# Patient Record
Sex: Female | Born: 2000 | Race: Black or African American | Hispanic: No | Marital: Single | Smoking: Never smoker
Health system: Southern US, Community
[De-identification: ages and names within clinical notes are randomized; demographics above are authoritative.]

## PROBLEM LIST (undated history)

## (undated) DIAGNOSIS — F419 Anxiety disorder, unspecified: Secondary | ICD-10-CM

## (undated) DIAGNOSIS — D649 Anemia, unspecified: Secondary | ICD-10-CM

## (undated) HISTORY — DX: Anxiety disorder, unspecified: F41.9

## (undated) HISTORY — PX: NO PAST SURGERIES: SHX2092

## (undated) HISTORY — DX: Anemia, unspecified: D64.9

## (undated) HISTORY — PX: OTHER SURGICAL HISTORY: SHX169

---
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
## 2020-02-01 ENCOUNTER — Telehealth: Payer: Self-pay

## 2020-02-01 NOTE — Telephone Encounter (Signed)
Attempted to call re: PP appt; left voicemail message Sharlette Dense, RN

## 2020-03-29 ENCOUNTER — Encounter: Payer: Self-pay | Admitting: Family Medicine

## 2020-03-29 ENCOUNTER — Other Ambulatory Visit: Payer: Self-pay

## 2020-03-29 ENCOUNTER — Ambulatory Visit: Payer: Medicaid Other | Admitting: Family Medicine

## 2020-03-29 DIAGNOSIS — B9689 Other specified bacterial agents as the cause of diseases classified elsewhere: Secondary | ICD-10-CM | POA: Diagnosis not present

## 2020-03-29 DIAGNOSIS — N760 Acute vaginitis: Secondary | ICD-10-CM | POA: Diagnosis not present

## 2020-03-29 DIAGNOSIS — Z113 Encounter for screening for infections with a predominantly sexual mode of transmission: Secondary | ICD-10-CM

## 2020-03-29 DIAGNOSIS — Z299 Encounter for prophylactic measures, unspecified: Secondary | ICD-10-CM

## 2020-03-29 LAB — WET PREP FOR TRICH, YEAST, CLUE
Trichomonas Exam: NEGATIVE
Yeast Exam: NEGATIVE

## 2020-03-29 MED ORDER — METRONIDAZOLE 0.75 % VA GEL: 1.0000 | Freq: Every day | VAGINAL | 0 refills | Status: AC

## 2020-03-29 MED ORDER — CLOTRIMAZOLE 1 % VA CREA: 1.0000 | TOPICAL_CREAM | Freq: Every day | VAGINAL | 0 refills | Status: AC

## 2020-03-29 NOTE — Progress Notes (Signed)
Patient into clinic with symptoms.  Per wet mount patient with BV and reports that she is not able to tolerate po Metronidazole.  Patient treated for BV with Vandazole 0.75% gel and also given Clotrimazole 1% vaginal cream to use externally for symptoms while using antibiotic get.

## 2020-03-29 NOTE — Progress Notes (Signed)
[facility] Department STI clinic/screening visit  Subjective:  Stefanie Waters is a 19 y.o. female being seen today for an STI screening visit. The patient reports they do have symptoms.  Patient reports that they do not desire a pregnancy in the next year.   They reported they are not interested in discussing contraception today.  Patient's last menstrual period was 03/14/2020.   Patient has the following medical conditions:   Patient Active Problem List   Diagnosis Date Noted  . Normal labor 12/03/2019  . Biological false positive RPR test on 09/29/19 10/07/2019  . Insufficient prenatal care in second trimester 08/27/2019  . Anemia during pregnancy 08/27/2019  . Depressed mood 08/27/2019  . Abdominal pain affecting pregnancy 08/24/2019  . Ovarian cyst affecting pregnancy, antepartum 05/27/2019  . Supervision of normal first pregnancy, antepartum 05/17/2019    No chief complaint on file.   HPI  Patient reports she has an itchy rash in her genital area for several days.  States the last time she had this problem she had BV. States she was treated for BV about 1 month ago- she was given medicine that tasted bad.  States she and partner waited 2 weeks before resuming sex.  Last HIV test per patient/review of record was 2020 Patient reports last pap was never.   See flowsheet for further details and programmatic requirements.    The following portions of the patient's history were reviewed and updated as appropriate: allergies, current medications, past medical history, past social history, past surgical history and problem list.  Objective:  There were no vitals filed for this visit.  Physical Exam Vitals and nursing note reviewed.  Constitutional:      Appearance: Normal appearance.  HENT:     Head: Normocephalic and atraumatic.     Mouth/Throat:     Mouth: Mucous membranes are moist.     Pharynx: Oropharynx is clear. No oropharyngeal exudate or posterior  oropharyngeal erythema.  Pulmonary:     Effort: Pulmonary effort is normal.  Abdominal:     General: Abdomen is flat.     Palpations: There is no mass.     Tenderness: There is no abdominal tenderness. There is no rebound.  Genitourinary:    General: Normal vulva.     Exam position: Lithotomy position.     Pubic Area: No rash or pubic lice.      Labia:        Right: No rash or lesion.        Left: No rash or lesion.      Vagina: Vaginal discharge present. No erythema, bleeding or lesions.     Cervix: No cervical motion tenderness, discharge, friability, lesion or erythema.     Uterus: Normal.      Adnexa: Right adnexa normal and left adnexa normal.     Rectum: Normal.     Comments: Thin white disch with fishy odor. PH > 4.5 Lymphadenopathy:     Head:     Right side of head: No preauricular or posterior auricular adenopathy.     Left side of head: No preauricular or posterior auricular adenopathy.     Cervical: No cervical adenopathy.     Upper Body:     Right upper body: No supraclavicular or axillary adenopathy.     Left upper body: No supraclavicular or axillary adenopathy.     Lower Body: No right inguinal adenopathy. No left inguinal adenopathy.  Skin:    General: Skin is warm and  dry.     Findings: No rash.  Neurological:     Mental Status: She is alert and oriented to person, place, and time.    Assessment and Plan:  Stefanie Waters is a 19 y.o. female presenting to the [facility] Department for STI screening  1. Screening examination for venereal disease  - WET PREP FOR TRICH, YEAST, CLUE - Chlamydia/Gonorrhea [place] [facility] - HIV [place] [facility] - Syphilis Serology, [place]  1. Screening examination for venereal disease  - WET PREP FOR TRICH, YEAST, CLUE - Chlamydia/Gonorrhea [place] [facility] - HIV [place] [facility] - Syphilis Serology, [facility]  2. BV (bacterial vaginosis)  - metroNIDAZOLE (VANDAZOLE) 0.75 % vaginal gel; Place 1 Applicatorful  vaginally at bedtime for 5 days.  Dispense: 70 g; Refill: 0  3. Prophylactic measure  - clotrimazole (CLOTRIMAZOLE-7) 1 % vaginal cream; Place 1 Applicatorful vaginally at bedtime for 7 days.  Dispense: 45 g; Refill: 0    No follow-ups on file.  No future appointments.  Larene Pickett, FNP

## 2020-04-02 NOTE — Progress Notes (Signed)
Chart reviewed by Pharmacist  Suzanne Walker PharmD, Contract Pharmacist at [facility]  

## 2020-05-11 ENCOUNTER — Encounter: Payer: Self-pay | Admitting: Physician Assistant

## 2020-05-11 ENCOUNTER — Other Ambulatory Visit: Payer: Self-pay

## 2020-05-11 ENCOUNTER — Ambulatory Visit: Payer: Medicaid Other | Admitting: Physician Assistant

## 2020-05-11 DIAGNOSIS — Z113 Encounter for screening for infections with a predominantly sexual mode of transmission: Secondary | ICD-10-CM | POA: Diagnosis not present

## 2020-05-11 DIAGNOSIS — N760 Acute vaginitis: Secondary | ICD-10-CM | POA: Diagnosis not present

## 2020-05-11 DIAGNOSIS — B9689 Other specified bacterial agents as the cause of diseases classified elsewhere: Secondary | ICD-10-CM

## 2020-05-11 LAB — WET PREP FOR TRICH, YEAST, CLUE
Trichomonas Exam: NEGATIVE
Yeast Exam: NEGATIVE

## 2020-05-11 MED ORDER — METRONIDAZOLE 500 MG PO TABS: 500.0000 mg | ORAL_TABLET | Freq: Two times a day (BID) | ORAL | 0 refills | Status: AC

## 2020-05-11 NOTE — Progress Notes (Signed)
Post:  RN reviewed wetmount with patient. Metro #14 dispensed per C. Hampton orders. Provider orders complete.   Harvie Heck, RN

## 2020-05-11 NOTE — Progress Notes (Signed)
[facility] Department STI clinic/screening visit  Subjective:  Stefanie Waters is a 19 y.o. female being seen today for an STI screening visit. The patient reports they do have symptoms.  Patient reports that they do not desire a pregnancy in the next year.   They reported they are not interested in discussing contraception today.  Patient's last menstrual period was 04/16/2020.   Patient has the following medical conditions:   Patient Active Problem List   Diagnosis Date Noted  . Normal labor 12/03/2019  . Biological false positive RPR test on 09/29/19 10/07/2019  . Insufficient prenatal care in second trimester 08/27/2019  . Anemia during pregnancy 08/27/2019  . Depressed mood 08/27/2019  . Abdominal pain affecting pregnancy 08/24/2019  . Ovarian cyst affecting pregnancy, antepartum 05/27/2019  . Supervision of normal first pregnancy, antepartum 05/17/2019    Chief Complaint  Patient presents with  . SEXUALLY TRANSMITTED DISEASE    screening    HPI  Patient reports that she has had a discharge with a "sour" smell for 1 week.  Denies other symptoms.  States that she has a 11 month old daughter and that she is not breastfeeding.  Reports her last HIV test was 1 month ago and has not had a pap since she is not yet 19 yo.  States that she is using condoms as her BCM.    See flowsheet for further details and programmatic requirements.    The following portions of the patient's history were reviewed and updated as appropriate: allergies, current medications, past medical history, past social history, past surgical history and problem list.  Objective:  There were no vitals filed for this visit.  Physical Exam Constitutional:      General: She is not in acute distress.    Appearance: Normal appearance.  HENT:     Head: Normocephalic and atraumatic.     Comments: No nits,lice, or hair loss. No cervical, supraclavicular or axillary adenopathy.    Mouth/Throat:      Mouth: Mucous membranes are moist.     Pharynx: Oropharynx is clear. No oropharyngeal exudate or posterior oropharyngeal erythema.  Eyes:     Conjunctiva/sclera: Conjunctivae normal.  Pulmonary:     Effort: Pulmonary effort is normal.  Abdominal:     Palpations: Abdomen is soft. There is no mass.     Tenderness: There is no abdominal tenderness. There is no guarding or rebound.  Genitourinary:    General: Normal vulva.     Rectum: Normal.     Comments: External genitalia/pubic area without nits, lice, edema, erythema, lesions and inguinal adenopathy. Vagina with normal mucosa and small amount of yellowish/creamy discharge discharge, pH=>4.5. Cervix without visible lesions. Uterus firm, mobile, nt, no masses, no CMT, no adnexal tenderness or fullness. Musculoskeletal:     Cervical back: Neck supple. No tenderness.  Skin:    General: Skin is warm and dry.     Findings: No bruising, erythema, lesion or rash.  Neurological:     Mental Status: She is alert and oriented to person, place, and time.  Psychiatric:        Mood and Affect: Mood normal.        Thought Content: Thought content normal.        Judgment: Judgment normal.      Assessment and Plan:  Stefanie Waters is a 19 y.o. female presenting to the [facility] Department for STI screening  1. Screening for STD (sexually transmitted disease) Patient into clinic  with symptoms. Rec condoms with all sex. Await test results.  Counseled that RN will call if needs to RTC for treatment once results are back. - WET PREP FOR TRICH, YEAST, CLUE - Chlamydia/Gonorrhea [place] [facility] - HIV [place] [facility] - Syphilis Serology,  Lab  2. BV (bacterial vaginosis) Reviewed Steps to prevent BV and yeast: Wear all-cotton underwear Sleep without underwear Take showers instead of baths Wear loose fitting clothing, especially during warm/hot weather Use a hair dryer on low after bathing to dry the area Avoid scented  soaps and body washes Do not douche May try over the counter probiotics or boric acid gel or suppositories Stop smoking Will treat for BV with Metronidazole 500 mg #14 1 po BID for 7 days with food, no EtOH for 24 hr before and until 72 hr after completing medicine. No sex for 7 days. Enc to use OTC antifungal cream if has increased itching during or just after treatment with antibiotics. - metroNIDAZOLE (FLAGYL) 500 MG tablet; Take 1 tablet (500 mg total) by mouth 2 (two) times daily for 7 days.  Dispense: 14 tablet; Refill: 0     No follow-ups on file.  No future appointments.  Matt Holmes, PA

## 2020-05-23 ENCOUNTER — Telehealth: Payer: Self-pay | Admitting: Family Medicine

## 2020-05-23 NOTE — Telephone Encounter (Signed)
Call to patient to discuss positive TR.  Patient verified by password.  Patient informed of + CT result.  Patient schedueld for treatment appointment.  Patient verbalizes understanding at this time and has no further questions. Bethenny Losee, RN  

## 2020-05-24 ENCOUNTER — Other Ambulatory Visit: Payer: Self-pay

## 2020-05-24 ENCOUNTER — Ambulatory Visit: Payer: Medicaid Other

## 2020-05-24 DIAGNOSIS — A749 Chlamydial infection, unspecified: Secondary | ICD-10-CM

## 2020-05-24 MED ORDER — AZITHROMYCIN 500 MG PO TABS
1000.0000 mg | ORAL_TABLET | Freq: Once | ORAL | Status: AC
  Administered 2020-05-24: 1000 mg via ORAL

## 2020-05-30 ENCOUNTER — Telehealth: Payer: Self-pay | Admitting: Family Medicine

## 2020-05-30 NOTE — Telephone Encounter (Signed)
Call to patient. Patient reports her vagina felt swollen and having bloody discharge after taking the azithromycin on 10/13 for chlamydia. Patient denies having any medication allergies, denies vomiting medication after DOT, and reports that after taking a bath and shaving the swelling went away. Patient also denied being on her  period. RN counseled patient that bloody discharge can come from having an infection and that if the symptoms are worsening that she should schedule an appt for follow up. Patient reports her symptoms are getting better and will call if the symptoms come back.   Harvie Heck, RN

## 2020-05-30 NOTE — Telephone Encounter (Signed)
Patient is calling regarding question about treatment for STI. Patient is not sure if she is having a reaction to the medicine she took a week ago. Stating she is having bloody discharge.

## 2020-10-31 IMAGING — US US OB COMP LESS 14 WK
1 series · 14 of 28 positions shown · non-contrast
Comparison: None.

CLINICAL DATA: Pelvic pain and vaginal bleeding. Fourteen weeks and
5 days pregnant by last menstrual period.

EXAM:
OBSTETRIC <14 WK ULTRASOUND
TECHNIQUE: Transabdominal ultrasound was performed for evaluation of the
gestation as well as the maternal uterus and adnexal regions.

[Series 1: us ob comp less 14 wk · 14 of 48 slices shown]
[im 2/48]
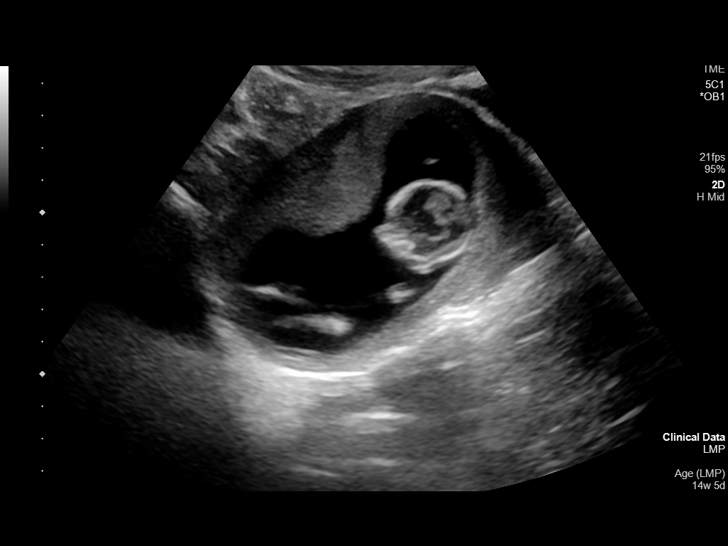
[im 6/48]
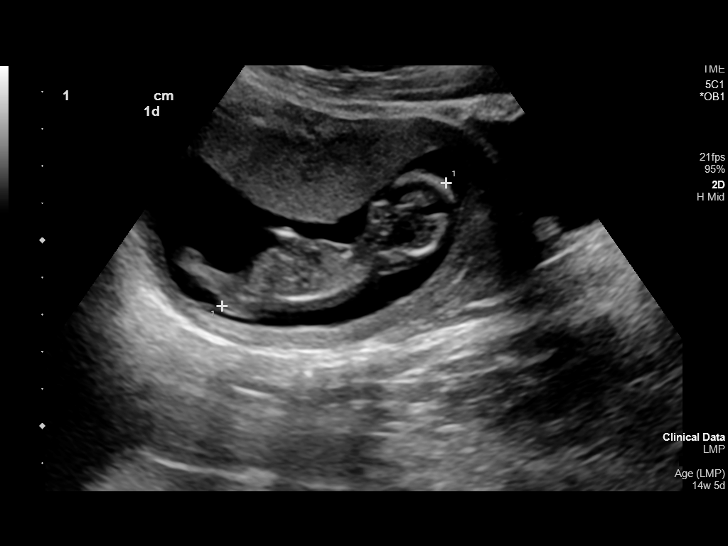
[im 9/48]
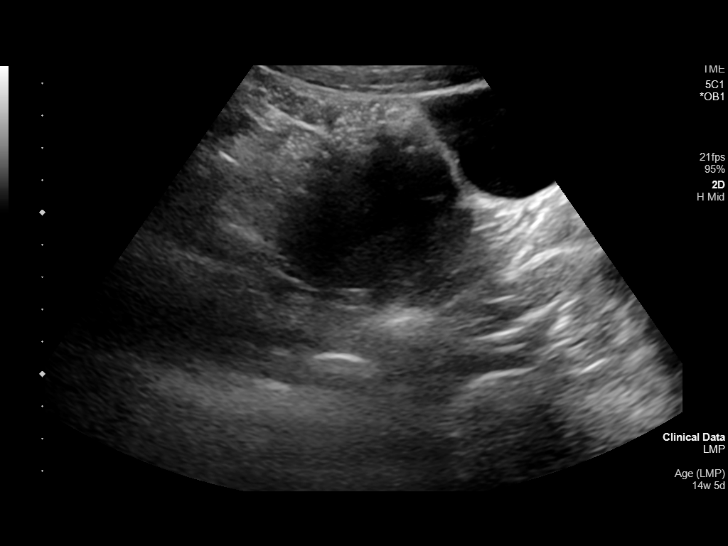
[im 13/48]
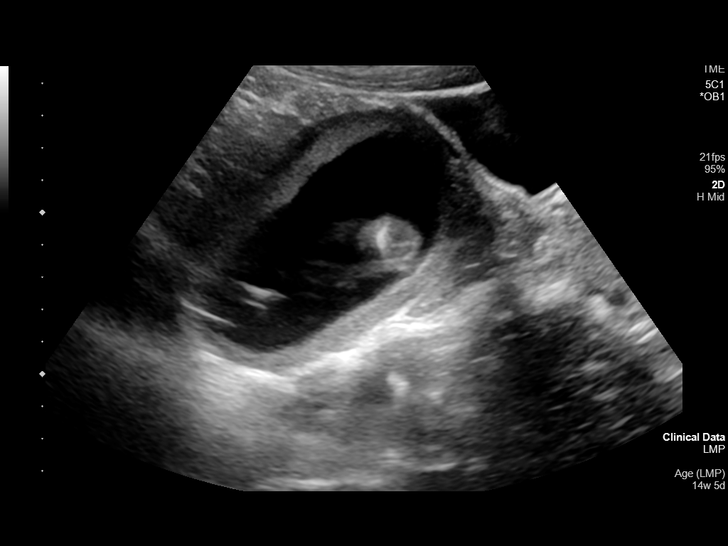
[im 16/48]
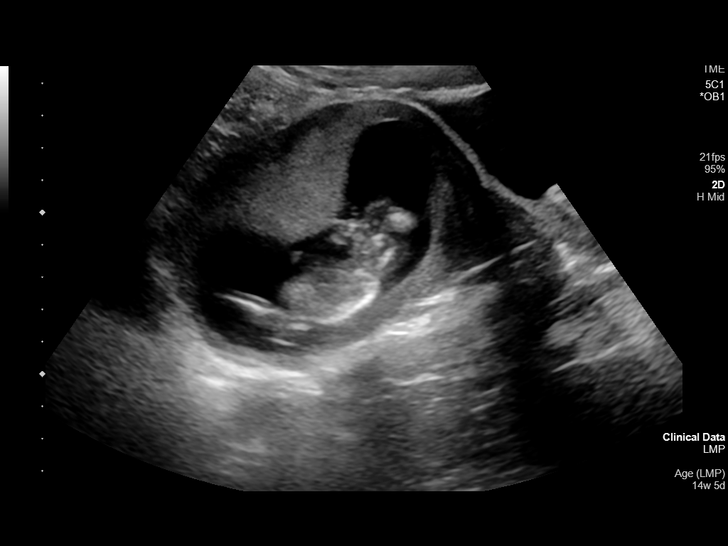
[im 20/48]
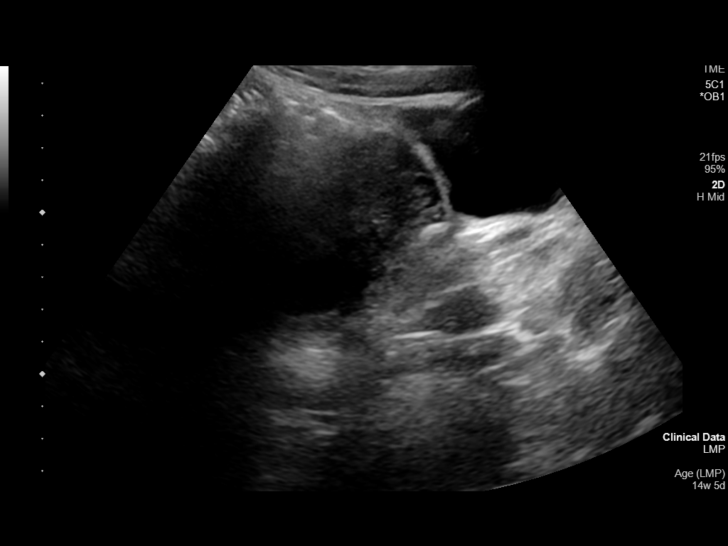
[im 23/48]
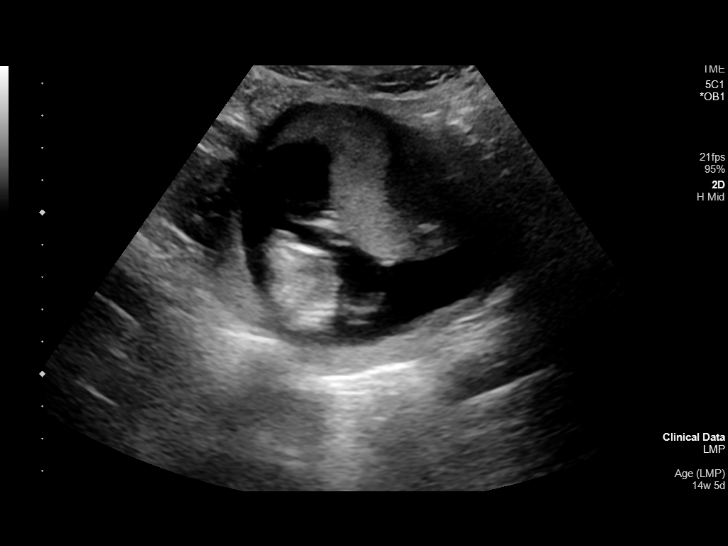
[im 27/48]
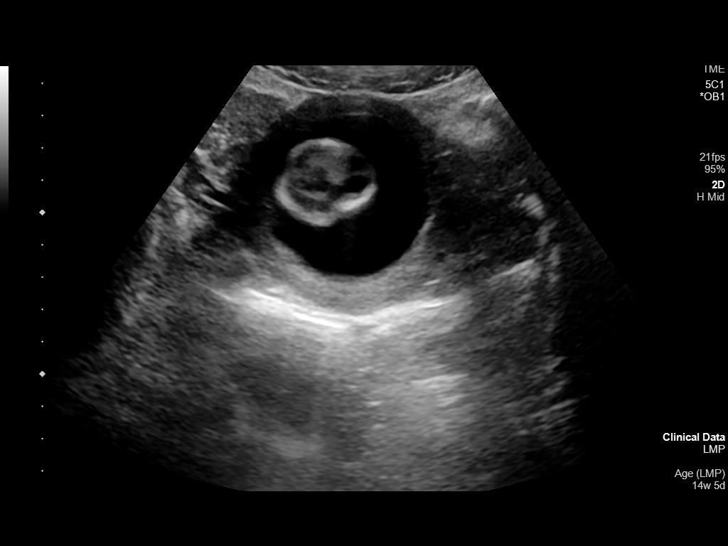
[im 30/48]
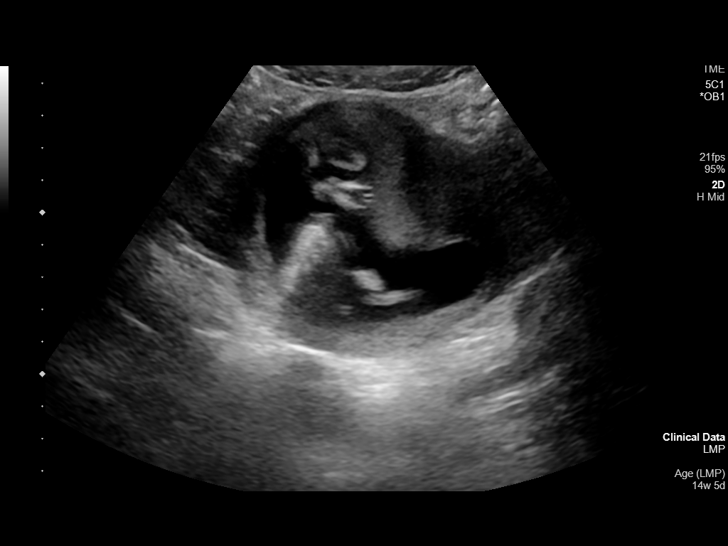
[im 34/48]
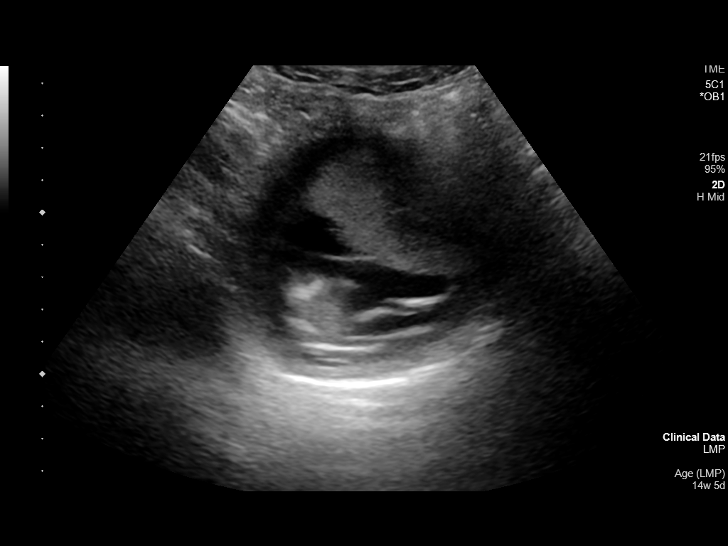
[im 37/48]
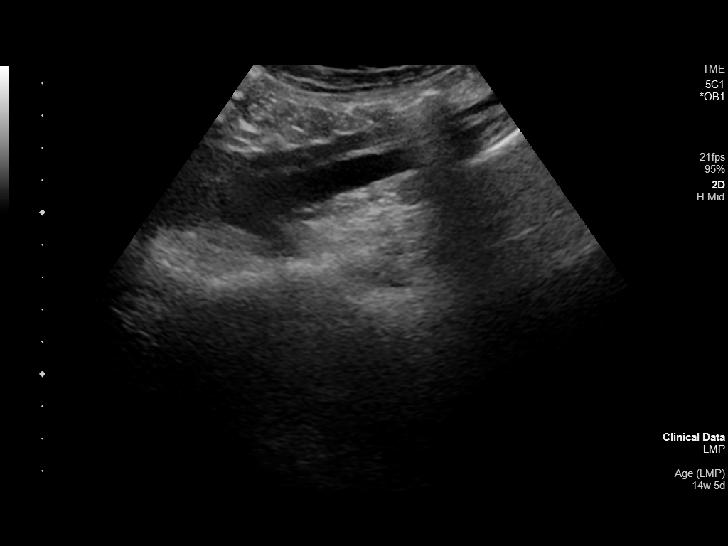
[im 41/48]
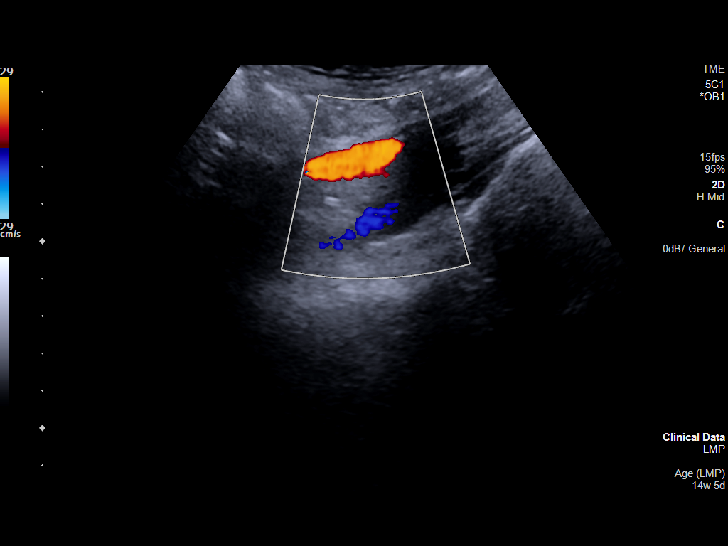
[im 44/48]
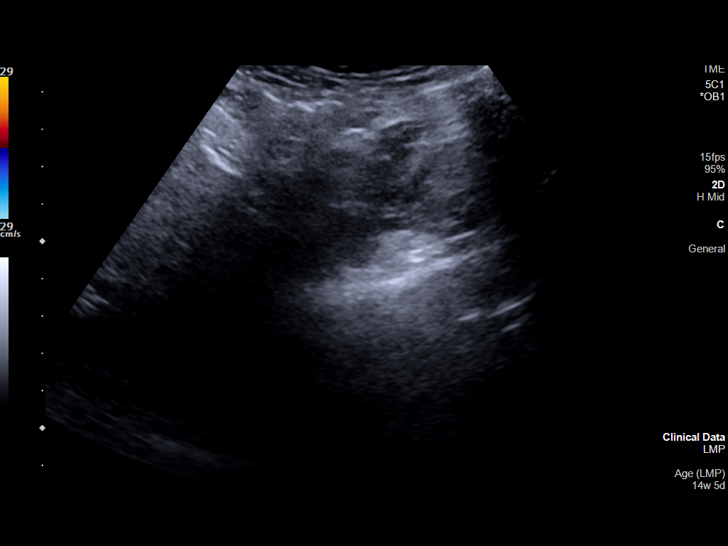
[im 48/48]
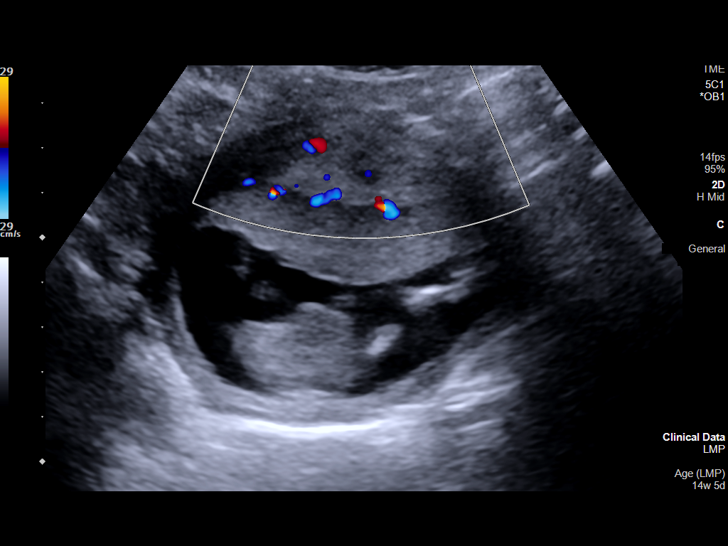

[14 of 28 positions shown; findings below may reference images not displayed]

FINDINGS: Intrauterine gestational sac: Visualized

Yolk sac:  Not visualized

Embryo:  Visualized

Cardiac Activity: Visualized

Heart Rate: 171 bpm

CRL:   68.7 mm   13 w 1 d                  US EDC: 12/17/2019

Subchorionic hemorrhage:  None visualized.

Maternal uterus/adnexae: The ovaries were not visualized. No free
peritoneal fluid.
IMPRESSION: 1. Single live intrauterine gestation with an estimated gestational
age of 13 weeks and 1 day. No complicating features.
2. Nonvisualized maternal ovaries.

## 2021-01-16 ENCOUNTER — Other Ambulatory Visit: Payer: Self-pay

## 2021-01-16 ENCOUNTER — Ambulatory Visit: Payer: Medicaid Other | Admitting: Physician Assistant

## 2021-01-16 DIAGNOSIS — Z202 Contact with and (suspected) exposure to infections with a predominantly sexual mode of transmission: Secondary | ICD-10-CM

## 2021-01-16 DIAGNOSIS — Z113 Encounter for screening for infections with a predominantly sexual mode of transmission: Secondary | ICD-10-CM

## 2021-01-16 LAB — WET PREP FOR TRICH, YEAST, CLUE
Trichomonas Exam: NEGATIVE
Yeast Exam: NEGATIVE

## 2021-01-16 MED ORDER — DOXYCYCLINE HYCLATE 100 MG PO TABS: 100.0000 mg | ORAL_TABLET | Freq: Two times a day (BID) | ORAL | 0 refills | Status: AC

## 2021-01-16 NOTE — Progress Notes (Signed)
[facility] Department STI clinic/screening visit  Subjective:  Stefanie Waters is a 20 y.o. female being seen today for an STI screening visit. The patient reports they do have symptoms.  Patient reports that they do not desire a pregnancy in the next year.   They reported they are not interested in discussing contraception today.  No LMP recorded.   Patient has the following medical conditions:   Patient Active Problem List   Diagnosis Date Noted  . Normal labor 12/03/2019  . Biological false positive RPR test on 09/29/19 10/07/2019  . Insufficient prenatal care in second trimester 08/27/2019  . Anemia during pregnancy 08/27/2019  . Depressed mood 08/27/2019  . Abdominal pain affecting pregnancy 08/24/2019  . Ovarian cyst affecting pregnancy, antepartum 05/27/2019  . Supervision of normal first pregnancy, antepartum 05/17/2019    Chief Complaint  Patient presents with  . SEXUALLY TRANSMITTED DISEASE    screening    HPI  Patient reports that she is a contact to Chlamydia.  Reports that she has also had spotting and cramping for 1-2 weeks.  Denies chronic conditions, surgeries and regular medicines.  LMP 01/03/2021 and using condoms with all sex.  States last HIV test was in 2021 and has not had a pap since she is not yet 20 yo.   See flowsheet for further details and programmatic requirements.    The following portions of the patient's history were reviewed and updated as appropriate: allergies, current medications, past medical history, past social history, past surgical history and problem list.  Objective:  There were no vitals filed for this visit.  Physical Exam Constitutional:      General: She is not in acute distress.    Appearance: Normal appearance.  HENT:     Head: Normocephalic and atraumatic.  Eyes:     Conjunctiva/sclera: Conjunctivae normal.  Pulmonary:     Effort: Pulmonary effort is normal.  Genitourinary:    Rectum: Normal.  Skin:     General: Skin is warm and dry.  Neurological:     Mental Status: She is alert and oriented to person, place, and time.  Psychiatric:        Mood and Affect: Mood normal.        Behavior: Behavior normal.        Thought Content: Thought content normal.        Judgment: Judgment normal.      Assessment and Plan:  Stefanie Waters is a 20 y.o. female presenting to the [facility] Department for STI screening  1. Screening for STD (sexually transmitted disease) Patient into clinic with symptoms. Patient declines pelvic by provider and opts to self collect vaginal samples.  Counseled how to collect for accurate results.  Rec condoms with all sex. Await test results.  Counseled that RN will call if needs to RTC for treatment once results are back. - WET PREP FOR TRICH, YEAST, CLUE - Chlamydia/Gonorrhea [place] [facility] - [facility] [place] [facility] - Syphilis Serology, [place] [facility]  2. Chlamydia contact Will treat as a contact to Chlamydia with Doxycycline 100 mg #14 1 po BID for 7 days. No sex for 14 days and until after partner completes treatment. Enc to use OTC antifungal cream if has itching during or just after treatment with antibiotic. - doxycycline (VIBRA-TABS) 100 MG tablet; Take 1 tablet (100 mg total) by mouth 2 (two) times daily for 7 days.  Dispense: 14 tablet; Refill: 0     No  follow-ups on file.  No future appointments.  Matt Holmes, PA

## 2021-01-17 ENCOUNTER — Encounter: Payer: Self-pay | Admitting: Physician Assistant

## 2021-01-18 LAB — HM HIV SCREENING LAB: HM HIV Screening: NEGATIVE

## 2021-01-20 NOTE — Progress Notes (Signed)
Chart reviewed by Pharmacist  Suzanne Walker PharmD, Contract Pharmacist at [facility]  

## 2021-07-23 ENCOUNTER — Ambulatory Visit: Payer: Medicaid Other

## 2021-10-31 ENCOUNTER — Ambulatory Visit: Payer: Medicaid Other | Admitting: Licensed Clinical Social Worker

## 2021-10-31 DIAGNOSIS — F411 Generalized anxiety disorder: Secondary | ICD-10-CM
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
## 2021-11-14 ENCOUNTER — Ambulatory Visit: Payer: Medicaid Other | Admitting: Licensed Clinical Social Worker

## 2021-11-14 NOTE — Progress Notes (Unsigned)
Counselor/Therapist Progress Note ? ?Patient ID: Stefanie Waters, MRN: [id number],   ? ?Date: 11/14/2021 ? ?Time Spent: ***  ? ?Treatment Type: Psychotherapy ? ?Reported Symptoms: {CHL AMB Reported [phone]} ? ?Mental Status Exam: ? ?Appearance:   {PSY:22683}     ?Behavior:  {PSY:[id number]}  ?Motor:  {PSY:22302}  ?Speech/Language:   {PSY:22685}  ?Affect:  {PSY:22687}  ?Mood:  {PSY:31886}  ?Thought process:  {PSY:31888}  ?Thought content:    {PSY:661 087 [id number]}  ?Sensory/Perceptual disturbances:    {PSY:[id number]}  ?Orientation:  {PSY:30297}  ?Attention:  {PSY:22877}  ?Concentration:  {PSY:415 378 4794}  ?Memory:  {PSY:[id number]}  ?Fund of knowledge:   {PSY:415 378 4794}  ?Insight:    {PSY:415 378 4794}  ?Judgment:   {PSY:415 378 4794}  ?Impulse Control:  {PSY:415 378 4794}  ? ?Risk Assessment: ?Danger to Self:  No ?Self-injurious Behavior: No ?Danger to Others: No ?Duty to Warn:no ?Physical Aggression / Violence:No  ?Access to Firearms a concern: No  ?Gang Involvement:No  ? ?Subjective: Patient was engaged and cooperative throughout the session using time effectively to discuss  ? ?Patient voices continued motivation for treatment and understanding of  . Patient is likely to benefit from future treatment because  remains motivated to decrease  And   and reports benefit of regular sessions in addressing these symptoms.  ?  ? ?Interventions: Cognitive Behavioral Therapy ? Checked in with patient and reviewed previous session, including assessment and goal of treatment. Reviewed CBTs. Explored patient's goal of treatment and worked collaboratively to develop CBT treatment plan. Provided support through active listening, validation of feelings, and highlighted patient's strengths. ? ? ?Diagnosis:No diagnosis found. ? ?Plan: Patient's goal of treatment is to be a better person, doesn't want her hurt to limit her life and what she wants out of life  ? ?Future Appointments  ?Date Time Provider [place]  ?11/14/2021   1:50 PM Milton Ferguson, LCSW [facility] None  ? ? ? ? ?Milton Ferguson, LCSW ? ?

## 2021-11-15 ENCOUNTER — Telehealth: Payer: Self-pay | Admitting: Licensed Clinical Social Worker

## 2021-11-15 NOTE — Telephone Encounter (Signed)
LCSW returned patient's voice message to reschedule her missed appointment- LCSW lvm.  ?

## 2021-11-29 ENCOUNTER — Ambulatory Visit: Payer: Medicaid Other | Admitting: Licensed Clinical Social Worker

## 2021-11-29 DIAGNOSIS — F411 Generalized anxiety disorder: Secondary | ICD-10-CM

## 2021-11-29 NOTE — Progress Notes (Signed)
Counselor/Therapist Progress Note ? ?Patient ID: Stefanie Waters, MRN: [id number],   ? ?Date: 11/29/2021 ? ?Time Spent: 46 minutes  ? ?Treatment Type: Psychotherapy ? ?Reported Symptoms:  Anxiety, anxious thought, panic attack; sadness and low mood  ? ?Mental Status Exam: ? ?Appearance:   Casual, Neat, and Well Groomed     ?Behavior:  Appropriate  ?Motor:  Normal  ?Speech/Language:   Clear and Coherent  ?Affect:  Appropriate and Congruent  ?Mood:  normal  ?Thought process:  normal  ?Thought content:    WNL  ?Sensory/Perceptual disturbances:    WNL  ?Orientation:  oriented to person, place, time/date, and situation  ?Attention:  Good  ?Concentration:  Good  ?Memory:  WNL  ?Fund of knowledge:   Good  ?Insight:    Good  ?Judgment:   Good  ?Impulse Control:  Good  ? ?Risk Assessment: ?Danger to Self:  No ?Self-injurious Behavior: No ?Danger to Others: No ?Duty to Warn:no ?Physical Aggression / Violence:No  ?Access to Firearms a concern: No  ?Gang Involvement:No  ? ?Subjective: Patient was engaged and cooperative throughout the session using time effectively to discuss thoughts, feelings, treatment plan, and Core beliefs  ?Patient is likely to benefit from future treatment because she is motivated to decrease anxiety and improve functioning  and reports benefit of sessions in addressing these symptoms.  ? ?Interventions: Cognitive Behavioral Therapy ? Checked in with patient and reviewed previous session, including assessment and goal of treatment. Reviewed CBTs. Explored patient's goal of treatment and worked collaboratively to develop CBTs treatment plan. Provided psychoeducation on Core Beliefs and engaged patient in working through worksheet- identifying and disputing the evidence. Provided support through active listening, validation of feelings, and highlighted patient's strengths.  ? ?Diagnosis: ?  ICD-10-CM   ?1. GAD (generalized anxiety disorder)  F41.1   ?  ? ?Plan: Patient's goal of treatment is to be a better  person, doesn't want her hurt to limit her life and what she wants out of life  ?Treatment Target: Understand the relationship between thoughts, emotions, and behaviors  ?Psychoeducation on CBT model   ?Teach the connection between thoughts, emotions, and behaviors  ?Treatment Target: Increase realistic balanced thinking -to learn how to replace thinking with thoughts that are more accurate or helpful ?Explore patient's thoughts, beliefs, automatic thoughts, assumptions  ?Identify and replace unhelpful thinking patterns (upsetting ideas, self-talk and mental images) ?Process distress and allow for emotional release  ?Questioning and challenging thoughts ?Cognitive reappraisal  ?Restructuring, Socratic questioning  ?Provided psychoeducation on core beliefs, explore, and assist patient in identifying core beliefs  ?Treatment Target: Cognitive Defusion  ?Psychoeducation about cognitive fusion  ?Assess areas of life in which the client exhibits psychological flexibility  ?Examine what sort of unhelpful cognitive content the client is fusing with  ?Teach defusion skills  ?Bring awareness to thoughts ?Treatment Target: Increase Contact With the Present Moment  ?Explore client's ability to be ?in-touch? with the present moment ?Teach mindfulness skills  ?Noting or describing  ? ?Future Appointments  ?Date Time Provider Department Center  ?12/20/2021  3:00 PM Kathreen Cosier, LCSW [facility] None  ? ? ?Kathreen Cosier, LCSW ? ?

## 2021-12-20 ENCOUNTER — Ambulatory Visit: Payer: Medicaid Other | Admitting: Licensed Clinical Social Worker

## 2021-12-20 DIAGNOSIS — F411 Generalized anxiety disorder: Secondary | ICD-10-CM

## 2021-12-20 NOTE — Progress Notes (Signed)
Counselor/Therapist Progress Note ? ?Patient ID: Stefanie Waters, MRN: [id number],   ? ?Date: 12/20/2021 ? ?Time Spent: 50 minutes  ? ?Treatment Type: Psychotherapy ? ?Reported Symptoms:  Depressed mood, anxiety, ruminating thoughts, worries, irritability  ? ?Mental Status Exam: ? ?Appearance:   Casual, Neat, and Well Groomed     ?Behavior:  Appropriate and Sharing  ?Motor:  Normal  ?Speech/Language:   Clear and Coherent and Normal Rate  ?Affect:  Appropriate and Congruent  ?Mood:  normal  ?Thought process:  normal  ?Thought content:    WNL  ?Sensory/Perceptual disturbances:    WNL  ?Orientation:  oriented to person, place, time/date, and situation  ?Attention:  Good  ?Concentration:  Good  ?Memory:  WNL  ?Fund of knowledge:   Good  ?Insight:    Good  ?Judgment:   Good  ?Impulse Control:  Good  ? ?Risk Assessment: ?Danger to Self:  No ?Self-injurious Behavior: No ?Danger to Others: No ?Duty to Warn:no ?Physical Aggression / Violence:No  ?Access to Firearms a concern: No  ?Gang Involvement:No  ? ?Subjective: Patient was receptive to feedback and intervention from LCSW and actively and effectively participated throughout the session. Patient is likely to benefit from future treatment because she remains motivated to decrease symptoms and reports benefit of regular sessions in addressing unresolved childhood trauma.     ? ?Interventions: Cognitive Behavioral Therapy ?Checked in with patient regarding her week. Engaged patient in processing unresolved issues related to her childhood, validating her feelings of anger and sadness and reframing and challenging unhelpful thoughts leading to distress. Taught patient ABCD method and provided worksheet for home work task. Provided support through active listening, validation of feelings, and highlighted patient's strengths.   ? ?Diagnosis: ?  ICD-10-CM   ?1. GAD (generalized anxiety disorder)  F41.1   ?  ? ?Plan: Check in on homework task ABCD Method  ? ?Patient's goal of  treatment is to be a better person, doesn't want her hurt to limit her life and what she wants out of life  ?Treatment Target: Understand the relationship between thoughts, emotions, and behaviors  ?Psychoeducation on CBT model   ?Teach the connection between thoughts, emotions, and behaviors  ?Treatment Target: Increase realistic balanced thinking -to learn how to replace thinking with thoughts that are more accurate or helpful ?Explore patient's thoughts, beliefs, automatic thoughts, assumptions  ?Identify and replace unhelpful thinking patterns (upsetting ideas, self-talk and mental images) ?Process distress and allow for emotional release  ?Questioning and challenging thoughts ?Cognitive reappraisal  ?Restructuring, Socratic questioning  ?Provided psychoeducation on core beliefs, explore, and assist patient in identifying core beliefs  ?Treatment Target: Cognitive Defusion  ?Psychoeducation about cognitive fusion  ?Assess areas of life in which the client exhibits psychological flexibility  ?Examine what sort of unhelpful cognitive content the client is fusing with  ?Teach defusion skills  ?Bring awareness to thoughts ?Treatment Target: Increase Contact With the Present Moment  ?Explore client's ability to be ?in-touch? with the present moment ?Teach mindfulness skills  ?Noting or describing  ? ?Future Appointments  ?Date Time Provider Department Center  ?01/02/2022  4:00 PM Kathreen Cosier, LCSW [facility] None  ? ?Kathreen Cosier, LCSW ? ?

## 2022-01-02 ENCOUNTER — Ambulatory Visit: Payer: Medicaid Other | Admitting: Licensed Clinical Social Worker

## 2022-01-02 DIAGNOSIS — F411 Generalized anxiety disorder: Secondary | ICD-10-CM

## 2022-01-02 NOTE — Progress Notes (Signed)
Counselor/Therapist Progress Note  Patient ID: Stefanie Waters, MRN: [id number],    Date: 01/02/2022  Time Spent: 45 minutes   Treatment Type: Psychotherapy  Reported Symptoms:  reduction in overall anxiety and mood symptoms  Mental Status Exam:  Appearance:   Casual and Neat     Behavior:  Appropriate, Sharing, and Motivated  Motor:  Normal  Speech/Language:   Clear and Coherent and Normal Rate  Affect:  Appropriate and Congruent  Mood:  normal  Thought process:  normal  Thought content:    WNL  Sensory/Perceptual disturbances:    WNL  Orientation:  oriented to person, place, time/date, and situation  Attention:  Good  Concentration:  Good  Memory:  WNL  Fund of knowledge:   Good  Insight:    Good  Judgment:   Good  Impulse Control:  Good   Risk Assessment: Danger to Self:  No Self-injurious Behavior: No Danger to Others: No Duty to Warn:no Physical Aggression / Violence:No  Access to Firearms a concern: No  Gang Involvement:No   Subjective: Patient was receptive to feedback and intervention from LCSW and actively and effectively participated throughout the session. Patient completed homework task- ABCD worksheet and remains motivated to decrease  anxiety and depressive symptoms and reports benefit of regular sessions in addressing these symptoms.    Interventions: Cognitive Behavioral Therapy Checked in with patient regarding her week. Reviewed previous session homework - ABCD Method. LCSW and patient also reviewed use of reframing thoughts around unresolved issues with her biological mother, and discussed patient's experience of improvement in overall mood and anxiety. LCSW and patient discussed comfort found in sadness, LCSW validated patient's experience of this loss of a part of her identify and also taught patient about Polyvagal Theory as a way to discuss possible comfort found in sadness.Provided support through active listening, validation of feelings, and  highlighted patient's strengths.    Diagnosis:   ICD-10-CM   1. GAD (generalized anxiety disorder)  F41.1      Plan: Patient's goal of treatment is to be a better person, doesn't want her hurt to limit her life and what she wants out of life   Treatment Target: Understand the relationship between thoughts, emotions, and behaviors  Psychoeducation on CBT model   Teach the connection between thoughts, emotions, and behaviors  Treatment Target: Increase realistic balanced thinking -to learn how to replace thinking with thoughts that are more accurate or helpful Explore patient's thoughts, beliefs, automatic thoughts, assumptions  Identify and replace unhelpful thinking patterns (upsetting ideas, self-talk and mental images) Process distress and allow for emotional release  Questioning and challenging thoughts Cognitive reappraisal  Restructuring, Socratic questioning  Provided psychoeducation on core beliefs, explore, and assist patient in identifying core beliefs  Treatment Target: Cognitive Defusion  Psychoeducation about cognitive fusion  Assess areas of life in which the client exhibits psychological flexibility  Examine what sort of unhelpful cognitive content the client is fusing with  Teach defusion skills  Bring awareness to thoughts Treatment Target: Increase Contact With the Present Moment  Explore client's ability to be "in-touch" with the present moment Teach mindfulness skills  Noting or describing   Future Appointments  Date Time Provider Department Center  01/16/2022  4:00 PM Kathreen Cosier, LCSW [facility] None    Kathreen Cosier, LCSW

## 2022-01-16 ENCOUNTER — Ambulatory Visit: Payer: Medicaid Other | Admitting: Licensed Clinical Social Worker

## 2022-02-13 ENCOUNTER — Telehealth: Payer: Self-pay | Admitting: Licensed Clinical Social Worker

## 2022-02-13 NOTE — Telephone Encounter (Signed)
Patient lvm requesting an appointment.  

## 2022-02-28 ENCOUNTER — Telehealth: Payer: Self-pay | Admitting: Licensed Clinical Social Worker

## 2022-02-28 NOTE — Telephone Encounter (Signed)
Patient left vm for LCSW requesting to schedule an appt. LCSW attempted to call patient back and left half a vm before phone disconnected.

## 2022-03-13 ENCOUNTER — Ambulatory Visit: Payer: Medicaid Other | Admitting: Licensed Clinical Social Worker

## 2022-03-13 DIAGNOSIS — F411 Generalized anxiety disorder: Secondary | ICD-10-CM

## 2022-03-13 NOTE — Progress Notes (Signed)
Counselor/Therapist Progress Note  Patient ID: Stefanie Waters, MRN: [id number],    Date: 03/13/2022  Time Spent: 45 minutes    Treatment Type: Psychotherapy  Reported Symptoms:  Depressed mood, anhedonia, increased need for sleep, isolating   Mental Status Exam:  Appearance:   Casual and Neat     Behavior:  Appropriate and Sharing  Motor:  Normal  Speech/Language:   Clear and Coherent and Normal Rate  Affect:  Appropriate, Congruent, and Full Range  Mood:  normal  Thought process:  normal  Thought content:    WNL  Sensory/Perceptual disturbances:    WNL  Orientation:  oriented to person, place, time/date, and situation  Attention:  Good  Concentration:  Good  Memory:  WNL  Fund of knowledge:   Good  Insight:    Fair  Judgment:   Fair  Impulse Control:  Fair   Risk Assessment: Danger to Self:  No Self-injurious Behavior: No Danger to Others: No Duty to Warn:no Physical Aggression / Violence:No  Access to Firearms a concern: No  Gang Involvement:No   Subjective: Patient was engaged and cooperative throughout the session using time effectively to discuss thoughts and feelings. Patient voices depressive symptoms over the past few weeks due to feeling lonely and co-parenting challenges with ex-boyfriend/father of her daughter. She reports she has been isolating and staying in bed sleeping more. Patient reports continued motivation for treatment and benefit from sessions.      Interventions: Cognitive Behavioral Therapy Checked in with patient regarding their week. Discussed current symptoms and engaged patient in processing their thoughts and emotions about what they have experienced with ex-boyfriend/father of her child. LCSW provided psychoeducation on behavioral activation / opposite action in regard to her depressed state in which she is isolating and avoiding.  LCSW assisted patient in identifying opportunities to engage I positive supportive relationships. Provided support  through active listening, validation of feelings, and highlighted patient's strengths.    LCSW discussed with patient [facility]'s plans to begin billing for [facility] services and informed patient that this could impact future services.    Diagnosis:   ICD-10-CM   1. GAD (generalized anxiety disorder)  F41.1      Plan: Patient's goal of treatment is to be a better person, doesn't want her hurt to limit her life and what she wants out of life    Treatment Target: Understand the relationship between thoughts, emotions, and behaviors  Psychoeducation on CBT model   Teach the connection between thoughts, emotions, and behaviors  Treatment Target: Increase realistic balanced thinking -to learn how to replace thinking with thoughts that are more accurate or helpful Explore patient's thoughts, beliefs, automatic thoughts, assumptions  Identify and replace unhelpful thinking patterns (upsetting ideas, self-talk and mental images) Process distress and allow for emotional release  Questioning and challenging thoughts Cognitive reappraisal  Restructuring, Socratic questioning  Provided psychoeducation on core beliefs, explore, and assist patient in identifying core beliefs  Treatment Target: Cognitive Defusion  Psychoeducation about cognitive fusion  Assess areas of life in which the client exhibits psychological flexibility  Examine what sort of unhelpful cognitive content the client is fusing with  Teach defusion skills  Bring awareness to thoughts Treatment Target: Increase Contact With the Present Moment  Explore client's ability to be "in-touch" with the present moment Teach mindfulness skills  Noting or describing  Treatment Target: Decrease emotional mind Opposite action  Behavioral activation- engaging in both meaningful and pleasurable activities   Future Appointments  Date Time Provider Department  Center  03/28/2022  3:00 PM Kathreen Cosier, LCSW [facility] None    Kathreen Cosier, [place]

## 2022-03-28 ENCOUNTER — Ambulatory Visit: Payer: Medicaid Other | Admitting: Licensed Clinical Social Worker

## 2022-03-28 DIAGNOSIS — F411 Generalized anxiety disorder: Secondary | ICD-10-CM

## 2022-03-28 NOTE — Progress Notes (Signed)
Counselor/Therapist Progress Note  Patient ID: Stefanie Waters, MRN: [id number],    Date: 03/28/2022  Time Spent: 43 minutes    Treatment Type: Psychotherapy  Reported Symptoms:  Anxiety, anxious thoughts  Mental Status Exam:  Appearance:   Casual     Behavior:  Appropriate and Sharing  Motor:  Normal  Speech/Language:   Clear and Coherent and Normal Rate  Affect:  Appropriate, Congruent, and Full Range  Mood:  normal  Thought process:  normal  Thought content:    WNL  Sensory/Perceptual disturbances:    WNL  Orientation:  oriented to person, place, time/date, situation, and day of week  Attention:  Good  Concentration:  Good  Memory:  WNL  Fund of knowledge:   Good  Insight:    Good  Judgment:   Good  Impulse Control:  Good   Risk Assessment: Danger to Self:  No Self-injurious Behavior: No Danger to Others: No Duty to Warn:no Physical Aggression / Violence:No  Access to Firearms a concern: No  Gang Involvement:No   Subjective: Patient was engaged and cooperative throughout the session using time effectively to discuss thoughts and feelings. Patient voices improvement in mood, improved thinking and being more hopeful about her future. Patient is likely to benefit from future treatment because she remains motivated to decrease symptoms and improve functioning and reports benefit of regular sessions.      Interventions: Cognitive Behavioral Therapy Checked in with patient regarding their week and discussed overall improvement in mood LCSW assisted patient in processing their thoughts and emotions related to what they have experienced with their biological mom. Validated patient's feelings of sadness and anger and reframed unhelpful thoughts, also highlighted healthy thinking with patient. LCSW and patient discussed options for establishing a relationship with her mom, weighing the pros and cons consistent with patient's values. Provided support through active listening,  validation of feelings, and highlighted patient's strengths.    Diagnosis:   ICD-10-CM   1. GAD (generalized anxiety disorder)  F41.1      Plan: Patient's goal of treatment is to be a better person, doesn't want her hurt to limit her life and what she wants out of life    Treatment Target: Understand the relationship between thoughts, emotions, and behaviors  Psychoeducation on CBT model   Teach the connection between thoughts, emotions, and behaviors  Treatment Target: Increase realistic balanced thinking -to learn how to replace thinking with thoughts that are more accurate or helpful Explore patient's thoughts, beliefs, automatic thoughts, assumptions  Identify and replace unhelpful thinking patterns (upsetting ideas, self-talk and mental images) Process distress and allow for emotional release  Questioning and challenging thoughts Cognitive reappraisal  Restructuring, Socratic questioning  Provided psychoeducation on core beliefs, explore, and assist patient in identifying core beliefs  Treatment Target: Cognitive Defusion  Psychoeducation about cognitive fusion  Assess areas of life in which the client exhibits psychological flexibility  Examine what sort of unhelpful cognitive content the client is fusing with  Teach defusion skills  Bring awareness to thoughts Treatment Target: Increase Contact With the Present Moment  Explore client's ability to be "in-touch" with the present moment Teach mindfulness skills  Noting or describing  Treatment Target: Decrease emotional mind Opposite action  Behavioral activation- engaging in both meaningful and pleasurable activities   Future Appointments  Date Time Provider Department Center  04/17/2022  4:00 PM Kathreen Cosier, LCSW [facility] None      Kathreen Cosier, LCSW

## 2022-04-17 ENCOUNTER — Ambulatory Visit: Payer: Medicaid Other | Admitting: Licensed Clinical Social Worker

## 2022-04-17 NOTE — Progress Notes (Unsigned)
Counselor/Therapist Progress Note  Patient ID: Stefanie Waters, MRN: [id number],    Date: 04/17/2022  Time Spent: ***   Treatment Type: Psychotherapy  Reported Symptoms: {CHL AMB Reported Symptoms:[phone]}  Mental Status Exam:  Appearance:   {PSY:22683}     Behavior:  {PSY:[id number]}  Motor:  {PSY:22302}  Speech/Language:   {PSY:22685}  Affect:  {PSY:22687}  Mood:  {PSY:31886}  Thought process:  {PSY:31888}  Thought content:    {[phone]}  Sensory/Perceptual disturbances:    {PSY:[phone]}  Orientation:  {PSY:30297}  Attention:  {PSY:22877}  Concentration:  {PSY:[phone]}  Memory:  {[phone]}  Fund of knowledge:   {PSY:[phone]}  Insight:    {PSY:[phone]}  Judgment:   {PSY:[phone]}  Impulse Control:  {PSY:[phone]}   Risk Assessment: Danger to Self:  No Self-injurious Behavior: No Danger to Others: No Duty to Warn:no Physical Aggression / Violence:No  Access to Firearms a concern: No  Gang Involvement:No   Subjective: Patient was engaged and cooperative throughout the session using time effectively to discuss    . Patient was receptive to feedback and intervention from LCSW. Patient voices continued motivation for treatment and understanding of  . Patient is likely to benefit from future treatment because they remain motivated to decrease  and   and reports benefit of regular sessions.      Interventions: {PSY:[phone]}    Diagnosis:   ICD-10-CM   1. GAD (generalized anxiety disorder)  F41.1      Plan: Patient's goal of treatment is to be a better person, doesn't want her hurt to limit her life and what she wants out of life    Treatment Target: Understand the relationship between thoughts, emotions, and behaviors  Psychoeducation on CBT model   Teach the connection between thoughts, emotions, and behaviors  Treatment Target: Increase realistic balanced thinking -to learn how to replace thinking with thoughts that are more accurate  or helpful Explore patient's thoughts, beliefs, automatic thoughts, assumptions  Identify and replace unhelpful thinking patterns (upsetting ideas, self-talk and mental images) Process distress and allow for emotional release  Questioning and challenging thoughts Cognitive reappraisal  Restructuring, Socratic questioning  Provided psychoeducation on core beliefs, explore, and assist patient in identifying core beliefs  Treatment Target: Cognitive Defusion  Psychoeducation about cognitive fusion  Assess areas of life in which the client exhibits psychological flexibility  Examine what sort of unhelpful cognitive content the client is fusing with  Teach defusion skills  Bring awareness to thoughts Treatment Target: Increase Contact With the Present Moment  Explore client's ability to be "in-touch" with the present moment Teach mindfulness skills  Noting or describing  Treatment Target: Decrease emotional mind Opposite action  Behavioral activation- engaging in both meaningful and pleasurable activities   Future Appointments  Date Time Provider Department Center  04/17/2022  4:00 PM Kathreen Cosier, LCSW [facility] None   Kathreen Cosier, LCSW

## 2022-06-25 DIAGNOSIS — R69 Illness, unspecified: Secondary | ICD-10-CM | POA: Diagnosis not present

## 2022-06-25 DIAGNOSIS — Z7251 High risk heterosexual behavior: Secondary | ICD-10-CM | POA: Diagnosis not present

## 2022-07-18 DIAGNOSIS — R69 Illness, unspecified: Secondary | ICD-10-CM | POA: Diagnosis not present

## 2022-07-18 DIAGNOSIS — N368 Other specified disorders of urethra: Secondary | ICD-10-CM | POA: Diagnosis not present

## 2022-08-01 DIAGNOSIS — H1012 Acute atopic conjunctivitis, left eye: Secondary | ICD-10-CM | POA: Diagnosis not present

## 2022-08-22 DIAGNOSIS — F331 Major depressive disorder, recurrent, moderate: Secondary | ICD-10-CM | POA: Insufficient documentation

## 2022-08-22 DIAGNOSIS — R69 Illness, unspecified: Secondary | ICD-10-CM | POA: Diagnosis not present

## 2022-08-22 DIAGNOSIS — F411 Generalized anxiety disorder: Secondary | ICD-10-CM | POA: Diagnosis not present

## 2022-08-22 DIAGNOSIS — D649 Anemia, unspecified: Secondary | ICD-10-CM | POA: Diagnosis not present

## 2022-10-21 DIAGNOSIS — Z113 Encounter for screening for infections with a predominantly sexual mode of transmission: Secondary | ICD-10-CM | POA: Diagnosis not present

## 2022-10-21 DIAGNOSIS — N368 Other specified disorders of urethra: Secondary | ICD-10-CM | POA: Diagnosis not present

## 2022-11-20 ENCOUNTER — Ambulatory Visit (INDEPENDENT_AMBULATORY_CARE_PROVIDER_SITE_OTHER): Payer: Medicaid Other | Admitting: Urology

## 2022-11-20 VITALS — BP 126/78 | HR 80 | Ht 66.0 in | Wt 146.4 lb

## 2022-11-20 DIAGNOSIS — N368 Other specified disorders of urethra: Secondary | ICD-10-CM

## 2022-11-20 NOTE — Progress Notes (Signed)
   Marcelle Overlie Plume,acting as a scribe for Vanna Scotland, MD.,have documented all relevant documentation on the behalf of Vanna Scotland, MD,as directed by  Vanna Scotland, MD while in the presence of Vanna Scotland, MD.  11/20/2022 3:42 PM   Stefanie Waters 2000/11/03 [id number]  Referring provider: Clare Gandy, MD No address on file  Chief Complaint  Patient presents with   Establish Care   urethra mass    HPI: 22 year old female who was referred for further evaluation of an incidental finding of a cyst adjacent to the urethra, first noted during a physical exam in December and still present upon re-examination in March by an OB-GYN provider. She was undergoing routine STI screening at the time.   Today, the patient reports being asymptomatic with no pain, leakage, irritation, or history of urinary tract infections.   PMH: Past Medical History:  Diagnosis Date   Anemia    History    Surgical History: Past Surgical History:  Procedure Laterality Date   no surgical hx      Home Medications:  Allergies as of 11/20/2022   No Known Allergies      Medication List        Accurate as of November 20, 2022  3:42 PM. If you have any questions, ask your nurse or doctor.          STOP taking these medications    ferrous sulfate 324 (65 Fe) MG Tbec   ibuprofen 600 MG tablet Commonly known as: ADVIL   MICROGESTIN 1-20 MG-MCG tablet Generic drug: norethindrone-ethinyl estradiol   Prenatal Vitamin 27-0.8 MG Tabs       TAKE these medications    escitalopram 5 MG tablet Commonly known as: LEXAPRO Take by mouth.        Family History: Family History  Problem Relation Age of Onset   Diabetes Maternal Grandmother    Heart attack Maternal Uncle        Uncles son died of Heart Attack also    Social History:  reports that she has never smoked. She has never used smokeless tobacco. She reports that she does not currently use alcohol. She reports  that she does not currently use drugs after having used the following drugs: Marijuana.   Physical Exam: BP 126/78   Pulse 80   Ht 5\' 6"  (1.676 m)   Wt 146 lb 6 oz (66.4 kg)   LMP 10/23/2022   Breastfeeding No   BMI 23.63 kg/m   Constitutional:  Alert and oriented, No acute distress. HEENT: [place] AT, moist mucus membranes.  Trachea midline, no masses. GU: 1 cm mobile, cystic structure at the five o'clock position on her urethra, not inflamed, irritated, or indurated. No urethral discharge upon compression. The remainder of the pelvic exam was benign. Neurologic: Grossly intact, no focal deficits, moving all 4 extremities. Psychiatric: Normal mood and affect.  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 - No further imaging is needed - We discussed how surgery could be an option if the cyst becomes problematic in the future.   Return if symptoms worsen or fail to improve.   [facility] [address] [place], [place] [address] [phone]

## 2022-11-28 DIAGNOSIS — Z202 Contact with and (suspected) exposure to infections with a predominantly sexual mode of transmission: Secondary | ICD-10-CM | POA: Diagnosis not present

## 2022-12-19 DIAGNOSIS — N760 Acute vaginitis: Secondary | ICD-10-CM | POA: Diagnosis not present

## 2022-12-19 DIAGNOSIS — Z01411 Encounter for gynecological examination (general) (routine) with abnormal findings: Secondary | ICD-10-CM | POA: Diagnosis not present

## 2022-12-19 DIAGNOSIS — B9689 Other specified bacterial agents as the cause of diseases classified elsewhere: Secondary | ICD-10-CM | POA: Diagnosis not present

## 2022-12-19 DIAGNOSIS — Z1331 Encounter for screening for depression: Secondary | ICD-10-CM | POA: Diagnosis not present

## 2022-12-19 DIAGNOSIS — Z113 Encounter for screening for infections with a predominantly sexual mode of transmission: Secondary | ICD-10-CM | POA: Diagnosis not present

## 2022-12-19 DIAGNOSIS — N761 Subacute and chronic vaginitis: Secondary | ICD-10-CM | POA: Diagnosis not present

## 2022-12-19 DIAGNOSIS — Z1339 Encounter for screening examination for other mental health and behavioral disorders: Secondary | ICD-10-CM | POA: Diagnosis not present

## 2022-12-19 DIAGNOSIS — B3731 Acute candidiasis of vulva and vagina: Secondary | ICD-10-CM | POA: Diagnosis not present

## 2022-12-19 DIAGNOSIS — Z30011 Encounter for initial prescription of contraceptive pills: Secondary | ICD-10-CM | POA: Diagnosis not present

## 2022-12-23 DIAGNOSIS — F331 Major depressive disorder, recurrent, moderate: Secondary | ICD-10-CM | POA: Diagnosis not present

## 2022-12-23 DIAGNOSIS — Z7689 Persons encountering health services in other specified circumstances: Secondary | ICD-10-CM | POA: Diagnosis not present

## 2022-12-23 DIAGNOSIS — D649 Anemia, unspecified: Secondary | ICD-10-CM | POA: Diagnosis not present

## 2022-12-23 DIAGNOSIS — F411 Generalized anxiety disorder: Secondary | ICD-10-CM | POA: Diagnosis not present

## 2022-12-25 DIAGNOSIS — J069 Acute upper respiratory infection, unspecified: Secondary | ICD-10-CM | POA: Diagnosis not present

## 2022-12-25 DIAGNOSIS — R070 Pain in throat: Secondary | ICD-10-CM | POA: Diagnosis not present

## 2023-01-24 DIAGNOSIS — Z0279 Encounter for issue of other medical certificate: Secondary | ICD-10-CM | POA: Diagnosis not present

## 2023-02-17 DIAGNOSIS — Z202 Contact with and (suspected) exposure to infections with a predominantly sexual mode of transmission: Secondary | ICD-10-CM | POA: Diagnosis not present

## 2023-02-17 DIAGNOSIS — A749 Chlamydial infection, unspecified: Secondary | ICD-10-CM | POA: Diagnosis not present

## 2023-02-27 DIAGNOSIS — Z113 Encounter for screening for infections with a predominantly sexual mode of transmission: Secondary | ICD-10-CM | POA: Diagnosis not present

## 2023-02-27 DIAGNOSIS — B9689 Other specified bacterial agents as the cause of diseases classified elsewhere: Secondary | ICD-10-CM | POA: Diagnosis not present

## 2023-02-27 DIAGNOSIS — N760 Acute vaginitis: Secondary | ICD-10-CM | POA: Diagnosis not present

## 2023-03-03 DIAGNOSIS — R7989 Other specified abnormal findings of blood chemistry: Secondary | ICD-10-CM | POA: Diagnosis not present

## 2023-03-03 DIAGNOSIS — F3341 Major depressive disorder, recurrent, in partial remission: Secondary | ICD-10-CM | POA: Diagnosis not present

## 2023-03-28 DIAGNOSIS — R197 Diarrhea, unspecified: Secondary | ICD-10-CM | POA: Diagnosis not present

## 2023-03-28 DIAGNOSIS — Z20822 Contact with and (suspected) exposure to covid-19: Secondary | ICD-10-CM | POA: Diagnosis not present

## 2023-03-28 DIAGNOSIS — B349 Viral infection, unspecified: Secondary | ICD-10-CM | POA: Diagnosis not present

## 2023-05-09 DIAGNOSIS — O209 Hemorrhage in early pregnancy, unspecified: Secondary | ICD-10-CM | POA: Diagnosis not present

## 2023-05-09 DIAGNOSIS — R112 Nausea with vomiting, unspecified: Secondary | ICD-10-CM | POA: Diagnosis not present

## 2023-05-29 DIAGNOSIS — E559 Vitamin D deficiency, unspecified: Secondary | ICD-10-CM | POA: Insufficient documentation

## 2023-05-29 DIAGNOSIS — E538 Deficiency of other specified B group vitamins: Secondary | ICD-10-CM | POA: Insufficient documentation

## 2023-08-21 ENCOUNTER — Telehealth: Payer: Self-pay | Admitting: Family Medicine

## 2023-08-21 NOTE — Telephone Encounter (Signed)
 I reached out to the patient to get her scheduled for prenatal from the referral I received. No answer, message left.

## 2023-08-26 ENCOUNTER — Other Ambulatory Visit: Payer: Self-pay

## 2023-08-26 DIAGNOSIS — Z141 Cystic fibrosis carrier: Secondary | ICD-10-CM

## 2023-08-26 DIAGNOSIS — Z363 Encounter for antenatal screening for malformations: Secondary | ICD-10-CM

## 2023-09-17 DIAGNOSIS — Z141 Cystic fibrosis carrier: Secondary | ICD-10-CM | POA: Insufficient documentation

## 2023-09-18 ENCOUNTER — Ambulatory Visit: Payer: Medicaid Other

## 2023-09-18 DIAGNOSIS — Z141 Cystic fibrosis carrier: Secondary | ICD-10-CM

## 2023-10-14 ENCOUNTER — Ambulatory Visit: Payer: Medicaid Other

## 2023-10-14 ENCOUNTER — Other Ambulatory Visit: Payer: Medicaid Other

## 2023-10-16 ENCOUNTER — Telehealth: Payer: Self-pay

## 2023-10-22 DIAGNOSIS — O365990 Maternal care for other known or suspected poor fetal growth, unspecified trimester, not applicable or unspecified: Secondary | ICD-10-CM | POA: Insufficient documentation

## 2023-10-31 ENCOUNTER — Ambulatory Visit

## 2023-10-31 ENCOUNTER — Ambulatory Visit: Admitting: *Deleted

## 2023-10-31 ENCOUNTER — Ambulatory Visit (HOSPITAL_BASED_OUTPATIENT_CLINIC_OR_DEPARTMENT_OTHER): Admitting: Obstetrics and Gynecology

## 2023-10-31 ENCOUNTER — Other Ambulatory Visit: Payer: Self-pay

## 2023-10-31 ENCOUNTER — Other Ambulatory Visit: Payer: Self-pay | Admitting: *Deleted

## 2023-10-31 ENCOUNTER — Encounter: Payer: Self-pay | Admitting: *Deleted

## 2023-10-31 VITALS — BP 133/84 | HR 87

## 2023-10-31 DIAGNOSIS — Z3A30 30 weeks gestation of pregnancy: Secondary | ICD-10-CM | POA: Diagnosis present

## 2023-10-31 DIAGNOSIS — O35BXX0 Maternal care for other (suspected) fetal abnormality and damage, fetal cardiac anomalies, not applicable or unspecified: Secondary | ICD-10-CM | POA: Diagnosis not present

## 2023-10-31 DIAGNOSIS — O365930 Maternal care for other known or suspected poor fetal growth, third trimester, not applicable or unspecified: Secondary | ICD-10-CM | POA: Insufficient documentation

## 2023-10-31 DIAGNOSIS — Z141 Cystic fibrosis carrier: Secondary | ICD-10-CM | POA: Insufficient documentation

## 2023-10-31 DIAGNOSIS — O365990 Maternal care for other known or suspected poor fetal growth, unspecified trimester, not applicable or unspecified: Secondary | ICD-10-CM | POA: Diagnosis present

## 2023-10-31 DIAGNOSIS — Z363 Encounter for antenatal screening for malformations: Secondary | ICD-10-CM

## 2023-10-31 DIAGNOSIS — D649 Anemia, unspecified: Secondary | ICD-10-CM | POA: Diagnosis not present

## 2023-10-31 DIAGNOSIS — O09893 Supervision of other high risk pregnancies, third trimester: Secondary | ICD-10-CM | POA: Insufficient documentation

## 2023-10-31 DIAGNOSIS — O99013 Anemia complicating pregnancy, third trimester: Secondary | ICD-10-CM | POA: Diagnosis not present

## 2023-10-31 DIAGNOSIS — O26843 Uterine size-date discrepancy, third trimester: Secondary | ICD-10-CM

## 2023-10-31 NOTE — Progress Notes (Signed)
 Maternal-Fetal Medicine Consultation I had the pleasure of seeing Ms. Stefanie Waters today at [facility]. She is G2 P1001 at 30-weeks' gestation and is here for ultrasound evaluation. At your office ultrasound performed on 10/16/2023, the estimated fetal weight was at less than the 1st percentile.  Her pregnancy is dated by sure LMP date consistent with 7-week ultrasound.  Patient does not have any chronic medical conditions including hypertension. On cell-free fetal DNA screening, the risks of fetal aneuploidies are not increased.  She does not have gestational diabetes. 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  Ultrasound The estimated fetal weight and the abdominal circumference measurements are at the 1st percentile.  Head circumference measurement is at between -2 and -1 SD (normal).  All long bones appear short but there is no evidence of bowing or demineralization.  Fetal abdomen looks normal with no evidence of echogenicity or calcifications.  Amniotic fluid is normal and good fetal activity seen.  Umbilical artery Doppler showed normal forward diastolic flow.  Fetal anatomical survey appears normal but limited by advanced gestational age. NST is reactive. BPP 10/10.  Fetal growth restriction I explained the finding of fetal growth restriction that is difficult to differentiate from a constitutionally small for gestational age fetus.  I discussed the possible causes of fetal growth restriction including placental insufficiency (most common cause), chromosomal anomalies and fetal infections.  Patient did not give history of fever or rashes.  I explained that only amniocentesis will give a definitive result on the fetal karyotype and some genetic conditions (Microarray).  I explained amniocentesis procedure and possible complication of preterm delivery (1 and 500 procedures). Patient opted not to have amniocentesis.  I  discussed ultrasound protocol of monitoring fetal growth restriction. Timing of delivery: Since small for gestational age fetuses have a higher chance of having perinatal mortality and morbidity, delivery at 22 to [redacted] weeks gestation is reasonable.  If, however, severe fetal growth restriction is seen with normal antenatal testing, we will recommend delivery at [redacted] weeks gestation.  Recommendations -Weekly BPP, NST and UA Doppler studies till delivery. -Fetal growth assessment in 3 weeks. -NST may be performed at your office between our visits.  Consultation including face-to-face (more than 50%) counseling 30 minutes.

## 2023-10-31 NOTE — Procedures (Signed)
 Stefanie Waters Nov 01, 2000 [redacted]w[redacted]d  Fetus A Non-Stress Test Interpretation for 10/31/23  Indication: IUGR  Fetal Heart Rate A Mode: External Baseline Rate (A): 140 bpm Variability: Moderate, Minimal Accelerations: 10 x 10 Decelerations: None Multiple birth?: No  Uterine Activity Mode: Toco Contraction Frequency (min): none Resting Tone Palpated: Relaxed  Interpretation (Fetal Testing) Nonstress Test Interpretation: Reactive Comments: Tracing reviewed byDr. Judeth Cornfield

## 2023-11-03 ENCOUNTER — Inpatient Hospital Stay
Admission: EM | Admit: 2023-11-03 | Discharge: 2023-11-05 | DRG: 833 | Disposition: A | Discharge status: Other Acute Inpatient Hospital

## 2023-11-03 ENCOUNTER — Encounter: Payer: Self-pay | Admitting: Obstetrics and Gynecology

## 2023-11-03 ENCOUNTER — Other Ambulatory Visit: Payer: Self-pay

## 2023-11-03 DIAGNOSIS — O99820 Streptococcus B carrier state complicating pregnancy: Secondary | ICD-10-CM | POA: Diagnosis present

## 2023-11-03 DIAGNOSIS — O99013 Anemia complicating pregnancy, third trimester: Secondary | ICD-10-CM | POA: Diagnosis present

## 2023-11-03 DIAGNOSIS — O321XX0 Maternal care for breech presentation, not applicable or unspecified: Secondary | ICD-10-CM | POA: Diagnosis present

## 2023-11-03 DIAGNOSIS — Z833 Family history of diabetes mellitus: Secondary | ICD-10-CM | POA: Diagnosis not present

## 2023-11-03 DIAGNOSIS — O26899 Other specified pregnancy related conditions, unspecified trimester: Principal | ICD-10-CM

## 2023-11-03 DIAGNOSIS — D509 Iron deficiency anemia, unspecified: Secondary | ICD-10-CM | POA: Diagnosis present

## 2023-11-03 DIAGNOSIS — Z141 Cystic fibrosis carrier: Secondary | ICD-10-CM | POA: Diagnosis not present

## 2023-11-03 DIAGNOSIS — O365930 Maternal care for other known or suspected poor fetal growth, third trimester, not applicable or unspecified: Secondary | ICD-10-CM | POA: Diagnosis present

## 2023-11-03 DIAGNOSIS — O1413 Severe pre-eclampsia, third trimester: Principal | ICD-10-CM | POA: Diagnosis present

## 2023-11-03 DIAGNOSIS — Z8249 Family history of ischemic heart disease and other diseases of the circulatory system: Secondary | ICD-10-CM

## 2023-11-03 DIAGNOSIS — Z3A30 30 weeks gestation of pregnancy: Secondary | ICD-10-CM

## 2023-11-03 DIAGNOSIS — O99019 Anemia complicating pregnancy, unspecified trimester: Secondary | ICD-10-CM | POA: Diagnosis present

## 2023-11-03 DIAGNOSIS — R519 Headache, unspecified: Secondary | ICD-10-CM | POA: Diagnosis present

## 2023-11-03 DIAGNOSIS — B951 Streptococcus, group B, as the cause of diseases classified elsewhere: Secondary | ICD-10-CM | POA: Diagnosis present

## 2023-11-03 DIAGNOSIS — O365990 Maternal care for other known or suspected poor fetal growth, unspecified trimester, not applicable or unspecified: Secondary | ICD-10-CM | POA: Diagnosis present

## 2023-11-03 HISTORY — DX: Other specified pregnancy related conditions, unspecified trimester: O26.899

## 2023-11-03 LAB — COMPREHENSIVE METABOLIC PANEL
ALT: 9 U/L (ref 0–44)
AST: 19 U/L (ref 15–41)
Albumin: 2.9 g/dL — ABNORMAL LOW (ref 3.5–5.0)
Alkaline Phosphatase: 116 U/L (ref 38–126)
Anion gap: 9 (ref 5–15)
BUN: 11 mg/dL (ref 6–20)
CO2: 20 mmol/L — ABNORMAL LOW (ref 22–32)
Calcium: 8.6 mg/dL — ABNORMAL LOW (ref 8.9–10.3)
Chloride: 105 mmol/L (ref 98–111)
Creatinine, Ser: 0.55 mg/dL (ref 0.44–1.00)
GFR, Estimated: >60 mL/min (ref >60)
Glucose, Bld: 81 mg/dL (ref 70–99)
Potassium: 3.7 mmol/L (ref 3.5–5.1)
Sodium: 134 mmol/L — ABNORMAL LOW (ref 135–145)
Total Bilirubin: 0.6 mg/dL (ref 0.0–1.2)
Total Protein: 6.3 g/dL — ABNORMAL LOW (ref 6.5–8.1)

## 2023-11-03 LAB — CBC
HCT: 30.1 % — ABNORMAL LOW (ref 36.0–46.0)
Hemoglobin: 10.7 g/dL — ABNORMAL LOW (ref 12.0–15.0)
MCH: 30.9 pg (ref 26.0–34.0)
MCHC: 35.5 g/dL (ref 30.0–36.0)
MCV: 87 fL (ref 80.0–100.0)
Platelets: 203 10*3/uL (ref 150–400)
RBC: 3.46 MIL/uL — ABNORMAL LOW (ref 3.87–5.11)
RDW: 12.6 % (ref 11.5–15.5)
WBC: 7 10*3/uL (ref 4.0–10.5)
nRBC: 0 % (ref 0.0–0.2)

## 2023-11-03 LAB — PROTEIN / CREATININE RATIO, URINE
Creatinine, Urine: 126 mg/dL
Protein Creatinine Ratio: 2 mg/mg{creat} — ABNORMAL HIGH (ref 0.00–0.15)
Total Protein, Urine: 252 mg/dL

## 2023-11-03 MED ORDER — OXYTOCIN BOLUS FROM INFUSION: 333.0000 mL | Freq: Once | INTRAVENOUS | Status: DC

## 2023-11-03 MED ORDER — OXYTOCIN-SODIUM CHLORIDE 30-0.9 UT/500ML-% IV SOLN: 2.5000 [IU]/h | INTRAVENOUS | Status: DC

## 2023-11-03 MED ORDER — HYDRALAZINE HCL 20 MG/ML IJ SOLN: 10.0000 mg | INTRAMUSCULAR | Status: DC | PRN

## 2023-11-03 MED ORDER — LACTATED RINGERS IV SOLN: INTRAVENOUS | Status: AC

## 2023-11-03 MED ORDER — LIDOCAINE HCL (PF) 1 % IJ SOLN: 30.0000 mL | INTRAMUSCULAR | Status: DC | PRN

## 2023-11-03 MED ORDER — ACETAMINOPHEN 325 MG PO TABS
650.0000 mg | ORAL_TABLET | ORAL | Status: DC | PRN
  Administered 2023-11-04: 650 mg via ORAL
  Filled 2023-11-03: qty 2

## 2023-11-03 MED ORDER — SOD CITRATE-CITRIC ACID 500-334 MG/5ML PO SOLN: 30.0000 mL | ORAL | Status: DC | PRN

## 2023-11-03 MED ORDER — LABETALOL HCL 5 MG/ML IV SOLN: 40.0000 mg | INTRAVENOUS | Status: DC | PRN

## 2023-11-03 MED ORDER — MAGNESIUM SULFATE 40 GM/1000ML IV SOLN
2.0000 g/h | INTRAVENOUS | Status: DC
  Administered 2023-11-04 – 2023-11-05 (×2): 2 g/h via INTRAVENOUS
  Filled 2023-11-03 (×3): qty 1000

## 2023-11-03 MED ORDER — LABETALOL HCL 5 MG/ML IV SOLN: 80.0000 mg | INTRAVENOUS | Status: DC | PRN

## 2023-11-03 MED ORDER — MAGNESIUM SULFATE BOLUS VIA INFUSION
4.0000 g | Freq: Once | INTRAVENOUS | Status: AC
  Administered 2023-11-03: 4 g via INTRAVENOUS
  Filled 2023-11-03: qty 1000

## 2023-11-03 MED ORDER — ONDANSETRON HCL 4 MG/2ML IJ SOLN: 4.0000 mg | Freq: Four times a day (QID) | INTRAMUSCULAR | Status: DC | PRN

## 2023-11-03 MED ORDER — BETAMETHASONE SOD PHOS & ACET 6 (3-3) MG/ML IJ SUSP
12.0000 mg | INTRAMUSCULAR | Status: AC
  Administered 2023-11-03 – 2023-11-04 (×2): 12 mg via INTRAMUSCULAR
  Filled 2023-11-03 (×2): qty 5

## 2023-11-03 MED ORDER — DOCUSATE SODIUM 100 MG PO CAPS
100.0000 mg | ORAL_CAPSULE | Freq: Every day | ORAL | Status: DC
  Administered 2023-11-04 – 2023-11-05 (×2): 100 mg via ORAL
  Filled 2023-11-03 (×2): qty 1

## 2023-11-03 MED ORDER — LABETALOL HCL 5 MG/ML IV SOLN: 20.0000 mg | INTRAVENOUS | Status: DC | PRN

## 2023-11-03 MED ORDER — CALCIUM CARBONATE ANTACID 500 MG PO CHEW: 2.0000 | CHEWABLE_TABLET | ORAL | Status: DC | PRN

## 2023-11-03 MED ORDER — LACTATED RINGERS IV SOLN: 500.0000 mL | INTRAVENOUS | Status: AC | PRN

## 2023-11-03 MED ORDER — ACETAMINOPHEN 325 MG PO TABS
650.0000 mg | ORAL_TABLET | ORAL | Status: DC | PRN
  Administered 2023-11-03: 650 mg via ORAL
  Filled 2023-11-03: qty 2

## 2023-11-03 NOTE — OB Triage Note (Signed)
 Patient is G2P1 30w is seen at [facility], is here for swelling in legs and headache that started three days ago with intermittent one sided blurry vision. No complications reported with previous pregnancy, however this one is IUGR approx 2lbs reported as of recent. Patient vitals being taken and provider to be notified.

## 2023-11-03 NOTE — H&P (Cosign Needed Addendum)
 ANTEPARTUM ADMISSION HISTORY AND PHYSICAL NOTE   History of Present Illness: Stefanie Waters is a 23 y.o. G2P1001 at [redacted]w[redacted]d admitted for pre-eclampsia with severe features.  She presented tonight with reports of headache, worse on the right side, then changes of vision/blurry vision, and swelling in her ankles and face. These symptoms started 3 days ago and have worsened.  Patient reports the fetal movement as active. Patient reports uterine contraction  activity as none. Patient reports  vaginal bleeding as none. Patient describes fluid per vagina as None. Fetal presentation is breech.  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   Anemia during pregnancy 08/27/2019   Ovarian cyst affecting pregnancy, antepartum 05/27/2019   Supervision of normal first pregnancy, antepartum 05/17/2019    Past Medical History:  Diagnosis Date   Abdominal pain affecting pregnancy 08/24/2019   Seen at [facility] ER 08/24/19     Anemia    History   Anxiety    Biological false positive RPR test on 09/29/19 10/07/2019   Treponemal ab negative     Depressed mood 08/27/2019   08/27/19: [facility] to [facility]     Headache in pregnancy 11/03/2023   Insufficient prenatal care in second trimester 08/27/2019   Missed prenatal appts between 06/14/19 and 08/27/19  Assessed by [facility] for needs/barriers. None identified.      Normal labor 12/03/2019    Past Surgical History:  Procedure Laterality Date   NO PAST SURGERIES     no surgical hx      OB History  Gravida Para Term Preterm AB Living  2 1 1   1   SAB IAB Ectopic Multiple Live Births     0 1    # Outcome Date GA Lbr Len/2nd Weight Sex Type Anes PTL Lv  2 Current           1 Term 12/03/19 [redacted]w[redacted]d 08:20 / 01:03 2590 g F Vag-Spont EPI  LIV    Social History   Socioeconomic  History   Marital status: Single    Spouse name: Not on file   Number of children: Not on file   Years of education: 12   Highest education level: Not on file  Occupational History   Not on file  Tobacco Use   Smoking status: Never   Smokeless tobacco: Never  Vaping Use   Vaping status: Never Used  Substance and Sexual Activity   Alcohol use: Not Currently   Drug use: Not Currently    Types: Marijuana    Comment: Quit 2018   Sexual activity: Yes    Partners: Male  Other Topics Concern   Not on file  Social History Narrative   Not on file   Social Drivers of Health   Financial Resource Strain: Low Risk  (03/03/2023)   Received from [facility]   Overall Financial Resource Strain (CARDIA)    Difficulty of Paying Living Expenses: Not hard at all  Food Insecurity: No Food Insecurity (11/03/2023)   Hunger Vital Sign    Worried About Running Out of Food in the Last Year: Never true    Ran Out of Food in the Last Year: Never true  Transportation Needs: No Transportation Needs (11/03/2023)   PRAPARE - Administrator, Civil Service (Medical): No    Lack of Transportation (Non-Medical):  No  Physical Activity: Not on file  Stress: Not on file  Social Connections: Not on file    Family History  Problem Relation Age of Onset   Diabetes Maternal Grandmother    Heart attack Maternal Uncle        Uncles son died of Heart Attack also    No Known Allergies  Medications Prior to Admission  Medication Sig Dispense Refill Last Dose/Taking   escitalopram (LEXAPRO) 5 MG tablet Take by mouth. (Patient not taking: Reported on 11/03/2023)   Not Taking    Review of Systems - Negative except headache, changes of vision, swelling of ankles/feet and face  Vitals:  BP (!) 149/92   Pulse 68   Temp 99 F (37.2 C) (Oral)   Resp 15   LMP 04/04/2023   SpO2 100%  Vitals:   11/03/23 2120 11/03/23 2135 11/03/23 2150 11/03/23 2206  BP: (!) 161/100 (!) 149/94 (!)  150/94 (!) 137/90   11/03/23 2221 11/03/23 2236  BP: (!) 141/93 (!) 149/92    Physical Examination: CONSTITUTIONAL: Well-developed, well-nourished female in no acute distress.  HENT:  Normocephalic, atraumatic, External right and left ear normal. Oropharynx is clear and moist EYES: Conjunctivae and EOM are normal. Pupils are equal, round, and reactive to light. No scleral icterus.  NECK: Normal range of motion, supple, no masses SKIN: Skin is warm and dry. No rash noted. Not diaphoretic. No erythema. No pallor. NEUROLGIC: Alert and oriented to person, place, and time. Hyper reflexes, no clonus, muscle tone coordination. No cranial nerve deficit noted. PSYCHIATRIC: Normal mood and affect. Normal behavior. Normal judgment and thought content. CARDIOVASCULAR: Normal heart rate noted, regular rhythm RESPIRATORY: Effort and breath sounds normal, no problems with respiration noted ABDOMEN: Soft, nontender, nondistended, gravid. MUSCULOSKELETAL: Normal range of motion. No edema and no tenderness. 2+ distal pulses.  Cervix: deferred Membranes:intact Fetal Monitoring:Baseline: 135 bpm, Variability: moderate, and Accelerations: present , Decelerations: none, Category I tracing Tocometer: occasional  Labs:  Results for orders placed or performed during the hospital encounter of 11/03/23 (from the past 24 hours)  Comprehensive metabolic panel   Collection Time: 11/03/23  9:32 PM  Result Value Ref Range   Sodium 134 (L) 135 - 145 mmol/L   Potassium 3.7 3.5 - 5.1 mmol/L   Chloride 105 98 - 111 mmol/L   CO2 20 (L) 22 - 32 mmol/L   Glucose, Bld 81 70 - 99 mg/dL   BUN 11 6 - 20 mg/dL   Creatinine, Ser 8.29 0.44 - 1.00 mg/dL   Calcium 8.6 (L) 8.9 - 10.3 mg/dL   Total Protein 6.3 (L) 6.5 - 8.1 g/dL   Albumin 2.9 (L) 3.5 - 5.0 g/dL   AST 19 15 - 41 U/L   ALT 9 0 - 44 U/L   Alkaline Phosphatase 116 38 - 126 U/L   Total Bilirubin 0.6 0.0 - 1.2 mg/dL   GFR, Estimated >56 >21 mL/min   Anion gap 9  5 - 15  CBC   Collection Time: 11/03/23  9:32 PM  Result Value Ref Range   WBC 7.0 4.0 - 10.5 K/uL   RBC 3.46 (L) 3.87 - 5.11 MIL/uL   Hemoglobin 10.7 (L) 12.0 - 15.0 g/dL   HCT 30.8 (L) 65.7 - 84.6 %   MCV 87.0 80.0 - 100.0 fL   MCH 30.9 26.0 - 34.0 pg   MCHC 35.5 30.0 - 36.0 g/dL   RDW 96.2 95.2 - 84.1 %   Platelets 203 150 - 400  K/uL   nRBC 0.0 0.0 - 0.2 %  Protein / creatinine ratio, urine   Collection Time: 11/03/23  9:32 PM  Result Value Ref Range   Creatinine, Urine 126 mg/dL   Total Protein, Urine 252 mg/dL   Protein Creatinine Ratio 2.00 (H) 0.00 - 0.15 mg/mg[Cre]    Imaging Studies: [place] MFM OB DETAIL +14 WK Result Date: 10/31/2023 ----------------------------------------------------------------------  OBSTETRICS REPORT                       (Signed Final 10/31/2023 09:34 am) ---------------------------------------------------------------------- Patient Info  ID #:       [id number]                          D.O.B.:  17-Sep-2000 (22 yrs)(F)  Name:       Linnell Fulling                Visit Date: 10/31/2023 07:14 am ---------------------------------------------------------------------- Performed By  Attending:        Noralee Space MD        Ref. Address:     [facility]  Performed By:     Isac Sarna        [facility]                                  [facility]  Referred By:      Gustavo Lah                    CNM ---------------------------------------------------------------------- Orders  #  Description                           Code        Ordered By  1  [place] MFM OB DETAIL +14 WK               76811.01    ANNA Tami Lin  2  [place] MFM UA CORD DOPPLER                76820.02    ANNA MACKIE  3  [place] MFM FETAL BPP                      82956.2     ANNA MACKIE     W/NONSTRESS  ----------------------------------------------------------------------  #  Order #  Accession #                Episode #  1  [id number]                   [id number]                 [id number]  2  [id number]                   [id number]                 [id number]  3  [id number]                   [id number]                 [id number] ---------------------------------------------------------------------- Indications  Maternal care for known or suspected poor      O36.5930  fetal growth, third trimester, single or  unspecified fetus IUGR  Cystic Fibrosis (CF) Carrier, third trimester  O09.893  Anemia during pregnancy in third trimester     O99.013  Uterine size-date discrepancy, third trimester O26.843  Encounter for antenatal screening for          Z36.3  malformations  [redacted] weeks gestation of pregnancy                Z3A.30  LR NIPS  Echogenic intracardiac focus of the heart      O35.8XX0  (EIF) ---------------------------------------------------------------------- Fetal Evaluation  Num Of Fetuses:         1  Fetal Heart Rate(bpm):  157  Cardiac Activity:       Observed  Presentation:           Breech  Placenta:               Posterior  P. Cord Insertion:      Visualized  Amniotic Fluid  AFI FV:      Within normal limits  AFI Sum(cm)     %Tile       Largest Pocket(cm)  10.17           15          3.12  RUQ(cm)       RLQ(cm)       LUQ(cm)        LLQ(cm)  2.17          2.11          2.77           3.12 ---------------------------------------------------------------------- Biophysical Evaluation  Amniotic F.V:   Pocket => 2 cm             F. Tone:        Observed  F. Movement:    Observed                   N.S.T:          Reactive  F. Breathing:   Observed                   Score:          10/10 ---------------------------------------------------------------------- Biometry  BPD:      65.6  mm     G. Age:  26w 3d        < 1  %    CI:        67.31   %    70 - 86  FL/HC:      18.2   %    19.2 - 21.4  HC:       256   mm     G. Age:  27w 6d        < 1  %    HC/AC:      1.14        0.99 - 1.21  AC:      224.4  mm     G. Age:  26w 6d        < 1  %    FL/BPD:     71.0   %    71 - 87  FL:       46.6  mm     G. Age:  25w 4d        < 1  %    FL/AC:      20.8   %    20 - 24  HUM:      40.6  mm     G. Age:  24w 5d        < 5  %  CER:      35.1  mm     G. Age:  29w 3d         18  %  LV:        2.7  mm  CM:        7.2  mm  ULN:      39.8  mm     G. Age:  26w 0d        < 5  %  TIB:      37.8  mm     G. Age:  24w 2d        < 5  %  RAD:      34.8  mm     G. Age:  24w 3d          9  %  FIB:      38.9  mm     G. Age:  24w 4d          9  %  Est. FW:     937  gm      2 lb 1 oz    < 1  % ---------------------------------------------------------------------- OB History  Gravidity:    2         Term:   1  Living:       1 ---------------------------------------------------------------------- Gestational Age  LMP:           30w 0d        Date:  04/04/23                  EDD:   01/09/24  U/S Today:     26w 5d                                        EDD:   02/01/24  Best:          30w 0d     Det. By:  LMP  (04/04/23)          EDD:   01/09/24 ---------------------------------------------------------------------- Targeted Anatomy  Central Nervous System  Calvarium/Cranial V.:  Appears normal         Cereb./Vermis:          Appears normal  Cavum:  Appears normal         Cisterna Magna:         Appears normal  Lateral Ventricles:    Appears normal         Midline Falx:           Appears normal  Choroid Plexus:        Appears normal  Spine  Cervical:              Limited                Sacral:                 Limited  Thoracic:              Limited                Shape/Curvature:        Appears normal  Lumbar:                Limited  Head/Neck  Lips:                  Not well visualized    Profile:                Not well visualized  Neck:                  Not well visualized     Orbits/Eyes:            Not well visualized  Nuchal Fold:           Not applicable         Mandible:               Not well visualized  Nasal Bone:            Not well visualized    Maxilla:                Not well visualized  Thorax  4 Chamber View:        Appears normal; EIF    Interventr. Septum:     Not well visualized  Cardiac Rhythm:        Normal                 Cardiac Axis:           Normal  Cardiac Situs:         Appears normal         Diaphragm:              Appears normal  Rt Outflow Tract:      Not well visualized    3 Vessel View:          Appears normal  Lt Outflow Tract:      Not well visualized    3 V Trachea View:       Appears normal  Aortic Arch:           Appears normal         IVC:                    Appears normal  Ductal Arch:           Not well visualized    Crossing:               Appears normal  SVC:  Appears normal  Abdomen  Ventral Wall:          Appears normal         Lt Kidney:              Appears normal  Cord Insertion:        Appears normal         Rt Kidney:              Appears normal  Situs:                 Appears normal         Bladder:                Appears normal  Stomach:               Appears normal  Extremities  Lt Humerus:            Appears normal         Lt Femur:               Appears normal  Rt Humerus:            Appears normal         Rt Femur:               Appears normal  Lt Forearm:            Appears normal         Lt Lower Leg:           Appears normal  Rt Forearm:            Appears normal         Rt Lower Leg:           Appears normal  Lt Hand:               Open hand nml          Lt Foot:                Foot visualized  Rt Hand:               Open hand nml          Rt Foot:                Foot visualized  Other  Umbilical Cord:        Not well visualized    Genitalia:              Female-nml  Comment:     Technically difficult due to maternal habitus and fetal position. ----------------------------------------------------------------------  Doppler - Fetal Vessels  Umbilical Artery   S/D     %tile      RI    %tile      PI    %tile     PSV    ADFV    RDFV                                                     (cm/s)   3.47       80    0.71       81     1.2       89    37.49      No  No ---------------------------------------------------------------------- Cervix Uterus Adnexa  Cervix  Normal appearance by transabdominal scan  Uterus  No abnormality visualized.  Right Ovary  Size(cm)     2.16   x   1.73   x  1.47      Vol(ml): 2.88  Within normal limits.  Left Ovary  Size(cm)     2.07   x   2.44   x  1.85      Vol(ml): 4.89  Within normal limits.  Cul De Sac  No free fluid seen.  Adnexa  No abnormality visualized ---------------------------------------------------------------------- Impression  The estimated fetal weight and the abdominal circumference  measurements are at the 1st percentile.  Head circumference  measurement is at between -2 and -1 SD (normal).  All long  bones appear short but there is no evidence of bowing or  demineralization.  Fetal abdomen looks normal with no  evidence of echogenicity or calcifications.  Amniotic fluid is normal and good fetal activity seen.  Umbilical artery Doppler showed normal forward diastolic  flow.  Fetal anatomical survey appears normal but limited by  advanced gestational age.  NST is reactive. BPP 10/10.  xxxxxxxxxxxxxxxxxxxxxxxxxxxxxxxxxxxxxxxxxxxxxxxxxxxxxxxxx  Maternal-Fetal Medicine Consultation  I had the pleasure of seeing Ms. Karenann Mcgrory today at [facility]. She is G2 P1001 at 37-  weeks' gestation and is here for ultrasound evaluation.  At your office ultrasound performed on 10/16/2023, the  estimated fetal weight was at less than the 1st percentile.  Her pregnancy is dated by sure LMP date consistent with 7-  week ultrasound.  Patient does not have any chronic medical conditions  including hypertension.  On cell-free fetal DNA screening, the risks of fetal   aneuploidies are not increased.  She does not have  gestational diabetes.  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  Fetal growth restriction  I explained the finding of fetal growth restriction that is difficult  to differentiate from a constitutionally small for gestational  age fetus.  I discussed the possible causes of fetal growth restriction  including placental insufficiency (most common cause),  chromosomal anomalies and fetal infections.  Patient did not  give history of fever or rashes.  I explained that only amniocentesis will give a definitive result  on the fetal karyotype and some genetic conditions  (Microarray).  I explained amniocentesis procedure and  possible complication of preterm delivery (1 and 500  procedures). Patient opted not to have amniocentesis.  I discussed ultrasound protocol of monitoring fetal growth  restriction.  Timing of delivery: Since small for gestational age fetuses  have a higher chance of having perinatal mortality and  morbidity, delivery at 89 to [redacted] weeks gestation is reasonable.  If, however, severe fetal growth restriction is seen with normal  antenatal testing, we will recommend delivery at [redacted] weeks  gestation. ---------------------------------------------------------------------- Recommendations  -Weekly BPP, NST and UA Doppler studies till delivery.  -Fetal growth assessment in 3 weeks.  -NST may be performed at your office between our visits. ----------------------------------------------------------------------                 Noralee Space, MD Electronically Signed Final Report   10/31/2023 09:34 am ----------------------------------------------------------------------   [place] MFM UA CORD DOPPLER Result Date: 10/31/2023 ----------------------------------------------------------------------  OBSTETRICS REPORT                       (Signed Final 10/31/2023 09:34 am)  ----------------------------------------------------------------------  Patient Info  ID #:       [id number]                          D.O.B.:  [birth year] (22 yrs)(F)  Name:       TAYVIA FAUGHNAN                Visit Date: 10/31/2023 07:14 am ---------------------------------------------------------------------- Performed By  Attending:        Noralee Space MD        Ref. Address:     [facility]  Performed By:     Isac Sarna        [facility]                    BS [facility]                                  [facility]  Referred By:      Gustavo Lah                    CNM ---------------------------------------------------------------------- Orders  #  Description                           Code        Ordered By  1  [place] MFM OB DETAIL +14 WK               76811.01    ANNA MACKIE  2  [place] MFM UA CORD DOPPLER                76820.02    ANNA MACKIE  3  [place] MFM FETAL BPP                      63875.6     ANNA [facility]     W/NONSTRESS ----------------------------------------------------------------------  #  Order #                     Accession #                Episode #  1  [id number]                   [id number]                 [id number]  2  [id number]                   [id number]  [id number]  3  [id number]                   [id number]                 [id number] ---------------------------------------------------------------------- Indications  Maternal care for known or suspected poor      O36.5930  fetal growth, third trimester, single or  unspecified fetus IUGR  Cystic Fibrosis (CF) Carrier, third trimester  O09.893  Anemia during pregnancy in third trimester     O99.013  Uterine size-date discrepancy, third trimester O26.843  Encounter for antenatal screening for          Z36.3  malformations  [redacted] weeks gestation of pregnancy                Z3A.30   LR NIPS  Echogenic intracardiac focus of the heart      O35.8XX0  (EIF) ---------------------------------------------------------------------- Fetal Evaluation  Num Of Fetuses:         1  Fetal Heart Rate(bpm):  157  Cardiac Activity:       Observed  Presentation:           Breech  Placenta:               Posterior  P. Cord Insertion:      Visualized  Amniotic Fluid  AFI FV:      Within normal limits  AFI Sum(cm)     %Tile       Largest Pocket(cm)  10.17           15          3.12  RUQ(cm)       RLQ(cm)       LUQ(cm)        LLQ(cm)  2.17          2.11          2.77           3.12 ---------------------------------------------------------------------- Biophysical Evaluation  Amniotic F.V:   Pocket => 2 cm             F. Tone:        Observed  F. Movement:    Observed                   N.S.T:          Reactive  F. Breathing:   Observed                   Score:          10/10 ---------------------------------------------------------------------- Biometry  BPD:      65.6  mm     G. Age:  26w 3d        < 1  %    CI:        67.31   %    70 - 86                                                          FL/HC:      18.2   %    19.2 - 21.4  HC:       256   mm     G. Age:  27w 6d        < 1  %  HC/AC:      1.14        0.99 - 1.21  AC:      224.4  mm     G. Age:  26w 6d        < 1  %    FL/BPD:     71.0   %    71 - 87  FL:       46.6  mm     G. Age:  25w 4d        < 1  %    FL/AC:      20.8   %    20 - 24  HUM:      40.6  mm     G. Age:  24w 5d        < 5  %  CER:      35.1  mm     G. Age:  29w 3d         18  %  LV:        2.7  mm  CM:        7.2  mm  ULN:      39.8  mm     G. Age:  26w 0d        < 5  %  TIB:      37.8  mm     G. Age:  24w 2d        < 5  %  RAD:      34.8  mm     G. Age:  24w 3d          9  %  FIB:      38.9  mm     G. Age:  24w 4d          9  %  Est. FW:     937  gm      2 lb 1 oz    < 1  % ---------------------------------------------------------------------- OB History  Gravidity:    2         Term:   1   Living:       1 ---------------------------------------------------------------------- Gestational Age  LMP:           30w 0d        Date:  04/04/23                  EDD:   01/09/24  U/S Today:     26w 5d                                        EDD:   02/01/24  Best:          30w 0d     Det. By:  LMP  (04/04/23)          EDD:   01/09/24 ---------------------------------------------------------------------- Targeted Anatomy  Central Nervous System  Calvarium/Cranial V.:  Appears normal         Cereb./Vermis:          Appears normal  Cavum:                 Appears normal         Cisterna Magna:         Appears normal  Lateral Ventricles:    Appears normal  Midline Falx:           Appears normal  Choroid Plexus:        Appears normal  Spine  Cervical:              Limited                Sacral:                 Limited  Thoracic:              Limited                Shape/Curvature:        Appears normal  Lumbar:                Limited  Head/Neck  Lips:                  Not well visualized    Profile:                Not well visualized  Neck:                  Not well visualized    Orbits/Eyes:            Not well visualized  Nuchal Fold:           Not applicable         Mandible:               Not well visualized  Nasal Bone:            Not well visualized    Maxilla:                Not well visualized  Thorax  4 Chamber View:        Appears normal; EIF    Interventr. Septum:     Not well visualized  Cardiac Rhythm:        Normal                 Cardiac Axis:           Normal  Cardiac Situs:         Appears normal         Diaphragm:              Appears normal  Rt Outflow Tract:      Not well visualized    3 Vessel View:          Appears normal  Lt Outflow Tract:      Not well visualized    3 V Trachea View:       Appears normal  Aortic Arch:           Appears normal         IVC:                    Appears normal  Ductal Arch:           Not well visualized    Crossing:               Appears normal  SVC:                    Appears normal  Abdomen  Ventral Wall:          Appears normal         Lt Kidney:  Appears normal  Cord Insertion:        Appears normal         Rt Kidney:              Appears normal  Situs:                 Appears normal         Bladder:                Appears normal  Stomach:               Appears normal  Extremities  Lt Humerus:            Appears normal         Lt Femur:               Appears normal  Rt Humerus:            Appears normal         Rt Femur:               Appears normal  Lt Forearm:            Appears normal         Lt Lower Leg:           Appears normal  Rt Forearm:            Appears normal         Rt Lower Leg:           Appears normal  Lt Hand:               Open hand nml          Lt Foot:                Foot visualized  Rt Hand:               Open hand nml          Rt Foot:                Foot visualized  Other  Umbilical Cord:        Not well visualized    Genitalia:              Female-nml  Comment:     Technically difficult due to maternal habitus and fetal position. ---------------------------------------------------------------------- Doppler - Fetal Vessels  Umbilical Artery   S/D     %tile      RI    %tile      PI    %tile     PSV    ADFV    RDFV                                                     (cm/s)   3.47       80    0.71       81     1.2       89    37.49      No      No ---------------------------------------------------------------------- Cervix Uterus Adnexa  Cervix  Normal appearance by transabdominal scan  Uterus  No abnormality visualized.  Right Ovary  Size(cm)     2.16   x   1.73   x  1.47      Vol(ml): 2.88  Within normal limits.  Left Ovary  Size(cm)     2.07   x   2.44   x  1.85      Vol(ml): 4.89  Within normal limits.  Cul De Sac  No free fluid seen.  Adnexa  No abnormality visualized ---------------------------------------------------------------------- Impression  The estimated fetal weight and the abdominal circumference  measurements are at the  1st percentile.  Head circumference  measurement is at between -2 and -1 SD (normal).  All long  bones appear short but there is no evidence of bowing or  demineralization.  Fetal abdomen looks normal with no  evidence of echogenicity or calcifications.  Amniotic fluid is normal and good fetal activity seen.  Umbilical artery Doppler showed normal forward diastolic  flow.  Fetal anatomical survey appears normal but limited by  advanced gestational age.  NST is reactive. BPP 10/10.  xxxxxxxxxxxxxxxxxxxxxxxxxxxxxxxxxxxxxxxxxxxxxxxxxxxxxxxxx  Maternal-Fetal Medicine Consultation  I had the pleasure of seeing Ms. Liley Rake today at the  [facility]. She is G2 P1001 at 8-  weeks' gestation and is here for ultrasound evaluation.  At your office ultrasound performed on 10/16/2023, the  estimated fetal weight was at less than the 1st percentile.  Her pregnancy is dated by sure LMP date consistent with 7-  week ultrasound.  Patient does not have any chronic medical conditions  including hypertension.  On cell-free fetal DNA screening, the risks of fetal  aneuploidies are not increased.  She does not have  gestational diabetes.  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  Fetal growth restriction  I explained the finding of fetal growth restriction that is difficult  to differentiate from a constitutionally small for gestational  age fetus.  I discussed the possible causes of fetal growth restriction  including placental insufficiency (most common cause),  chromosomal anomalies and fetal infections.  Patient did not  give history of fever or rashes.  I explained that only amniocentesis will give a definitive result  on the fetal karyotype and some genetic conditions  (Microarray).  I explained amniocentesis procedure and  possible complication of preterm delivery (1 and 500  procedures). Patient opted not to have amniocentesis.  I discussed ultrasound  protocol of monitoring fetal growth  restriction.  Timing of delivery: Since small for gestational age fetuses  have a higher chance of having perinatal mortality and  morbidity, delivery at 30 to [redacted] weeks gestation is reasonable.  If, however, severe fetal growth restriction is seen with normal  antenatal testing, we will recommend delivery at [redacted] weeks  gestation. ---------------------------------------------------------------------- Recommendations  -Weekly BPP, NST and UA Doppler studies till delivery.  -Fetal growth assessment in 3 weeks.  -NST may be performed at your office between our visits. ----------------------------------------------------------------------                 Noralee Space, MD Electronically Signed Final Report   10/31/2023 09:34 am ----------------------------------------------------------------------   [place] MFM FETAL BPP W/NONSTRESS Result Date: 10/31/2023 ----------------------------------------------------------------------  OBSTETRICS REPORT                       (Signed Final 10/31/2023 09:34 am) ---------------------------------------------------------------------- Patient Info  ID #:       [id number]                          D.O.B.:  09/07/2000 (22 yrs)(F)  Name:       VALITA RIGHTER                Visit Date: 10/31/2023 07:14 am ---------------------------------------------------------------------- Performed By  Attending:        Noralee Space MD        Ref. Address:     [facility]  Performed By:     Isac Sarna        [facility]  Referred By:      Gustavo Lah                    CNM ---------------------------------------------------------------------- Orders  #  Description                           Code        Ordered By  1  [place] MFM OB  DETAIL +14 WK               76811.01    ANNA MACKIE  2  [place] MFM UA CORD DOPPLER                76820.02    ANNA MACKIE  3  [place] MFM FETAL BPP                      84696.2     ANNA [facility]     W/NONSTRESS ----------------------------------------------------------------------  #  Order #                     Accession #                Episode #  1  [id number]                   [id number]                 [id number]  2  [id number]                   [id number]                 [id number]  3  [id number]                   [id number]  [id number] ---------------------------------------------------------------------- Indications  Maternal care for known or suspected poor      O36.5930  fetal growth, third trimester, single or  unspecified fetus IUGR  Cystic Fibrosis (CF) Carrier, third trimester  O09.893  Anemia during pregnancy in third trimester     O99.013  Uterine size-date discrepancy, third trimester O26.843  Encounter for antenatal screening for          Z36.3  malformations  [redacted] weeks gestation of pregnancy                Z3A.30  LR NIPS  Echogenic intracardiac focus of the heart      O35.8XX0  (EIF) ---------------------------------------------------------------------- Fetal Evaluation  Num Of Fetuses:         1  Fetal Heart Rate(bpm):  157  Cardiac Activity:       Observed  Presentation:           Breech  Placenta:               Posterior  P. Cord Insertion:      Visualized  Amniotic Fluid  AFI FV:      Within normal limits  AFI Sum(cm)     %Tile       Largest Pocket(cm)  10.17           15          3.12  RUQ(cm)       RLQ(cm)       LUQ(cm)        LLQ(cm)  2.17          2.11          2.77           3.12 ---------------------------------------------------------------------- Biophysical Evaluation  Amniotic F.V:   Pocket => 2 cm             F. Tone:        Observed  F. Movement:    Observed                   N.S.T:          Reactive  F. Breathing:   Observed                   Score:          10/10  ---------------------------------------------------------------------- Biometry  BPD:      65.6  mm     G. Age:  26w 3d        < 1  %    CI:        67.31   %    70 - 86                                                          FL/HC:      18.2   %    19.2 - 21.4  HC:       256   mm     G. Age:  27w 6d        < 1  %    HC/AC:      1.14        0.99 - 1.21  AC:      224.4  mm     G. Age:  26w 6d        <  1  %    FL/BPD:     71.0   %    71 - 87  FL:       46.6  mm     G. Age:  25w 4d        < 1  %    FL/AC:      20.8   %    20 - 24  HUM:      40.6  mm     G. Age:  24w 5d        < 5  %  CER:      35.1  mm     G. Age:  29w 3d         18  %  LV:        2.7  mm  CM:        7.2  mm  ULN:      39.8  mm     G. Age:  26w 0d        < 5  %  TIB:      37.8  mm     G. Age:  24w 2d        < 5  %  RAD:      34.8  mm     G. Age:  24w 3d          9  %  FIB:      38.9  mm     G. Age:  24w 4d          9  %  Est. FW:     937  gm      2 lb 1 oz    < 1  % ---------------------------------------------------------------------- OB History  Gravidity:    2         Term:   1  Living:       1 ---------------------------------------------------------------------- Gestational Age  LMP:           30w 0d        Date:  04/04/23                  EDD:   01/09/24  U/S Today:     26w 5d                                        EDD:   02/01/24  Best:          30w 0d     Det. By:  LMP  (04/04/23)          EDD:   01/09/24 ---------------------------------------------------------------------- Targeted Anatomy  Central Nervous System  Calvarium/Cranial V.:  Appears normal         Cereb./Vermis:          Appears normal  Cavum:                 Appears normal         Cisterna Magna:         Appears normal  Lateral Ventricles:    Appears normal         Midline Falx:           Appears normal  Choroid Plexus:        Appears normal  Spine  Cervical:  Limited                Sacral:                 Limited  Thoracic:              Limited                 Shape/Curvature:        Appears normal  Lumbar:                Limited  Head/Neck  Lips:                  Not well visualized    Profile:                Not well visualized  Neck:                  Not well visualized    Orbits/Eyes:            Not well visualized  Nuchal Fold:           Not applicable         Mandible:               Not well visualized  Nasal Bone:            Not well visualized    Maxilla:                Not well visualized  Thorax  4 Chamber View:        Appears normal; EIF    Interventr. Septum:     Not well visualized  Cardiac Rhythm:        Normal                 Cardiac Axis:           Normal  Cardiac Situs:         Appears normal         Diaphragm:              Appears normal  Rt Outflow Tract:      Not well visualized    3 Vessel View:          Appears normal  Lt Outflow Tract:      Not well visualized    3 V Trachea View:       Appears normal  Aortic Arch:           Appears normal         IVC:                    Appears normal  Ductal Arch:           Not well visualized    Crossing:               Appears normal  SVC:                   Appears normal  Abdomen  Ventral Wall:          Appears normal         Lt Kidney:              Appears normal  Cord Insertion:        Appears normal         Rt Kidney:  Appears normal  Situs:                 Appears normal         Bladder:                Appears normal  Stomach:               Appears normal  Extremities  Lt Humerus:            Appears normal         Lt Femur:               Appears normal  Rt Humerus:            Appears normal         Rt Femur:               Appears normal  Lt Forearm:            Appears normal         Lt Lower Leg:           Appears normal  Rt Forearm:            Appears normal         Rt Lower Leg:           Appears normal  Lt Hand:               Open hand nml          Lt Foot:                Foot visualized  Rt Hand:               Open hand nml          Rt Foot:                Foot visualized  Other  Umbilical  Cord:        Not well visualized    Genitalia:              Female-nml  Comment:     Technically difficult due to maternal habitus and fetal position. ---------------------------------------------------------------------- Doppler - Fetal Vessels  Umbilical Artery   S/D     %tile      RI    %tile      PI    %tile     PSV    ADFV    RDFV                                                     (cm/s)   3.47       80    0.71       81     1.2       89    37.49      No      No ---------------------------------------------------------------------- Cervix Uterus Adnexa  Cervix  Normal appearance by transabdominal scan  Uterus  No abnormality visualized.  Right Ovary  Size(cm)     2.16   x   1.73   x  1.47      Vol(ml): 2.88  Within normal limits.  Left Ovary  Size(cm)     2.07   x   2.44   x  1.85  Vol(ml): 4.89  Within normal limits.  Cul De Sac  No free fluid seen.  Adnexa  No abnormality visualized ---------------------------------------------------------------------- Impression  The estimated fetal weight and the abdominal circumference  measurements are at the 1st percentile.  Head circumference  measurement is at between -2 and -1 SD (normal).  All long  bones appear short but there is no evidence of bowing or  demineralization.  Fetal abdomen looks normal with no  evidence of echogenicity or calcifications.  Amniotic fluid is normal and good fetal activity seen.  Umbilical artery Doppler showed normal forward diastolic  flow.  Fetal anatomical survey appears normal but limited by  advanced gestational age.  NST is reactive. BPP 10/10.  xxxxxxxxxxxxxxxxxxxxxxxxxxxxxxxxxxxxxxxxxxxxxxxxxxxxxxxxx  Maternal-Fetal Medicine Consultation  I had the pleasure of seeing Ms. Aiyanah Kalama [facility] at the  [facility]. She is G2 P1001 at 61-  weeks' gestation and is here for ultrasound evaluation.  At your office ultrasound performed on 10/16/2023, the  estimated fetal weight was at less than the 1st percentile.   Her pregnancy is dated by sure LMP date consistent with 7-  week ultrasound.  Patient does not have any chronic medical conditions  including hypertension.  On cell-free fetal DNA screening, the risks of fetal  aneuploidies are not increased.  She does not have  gestational diabetes.  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  Fetal growth restriction  I explained the finding of fetal growth restriction that is difficult  to differentiate from a constitutionally small for gestational  age fetus.  I discussed the possible causes of fetal growth restriction  including placental insufficiency (most common cause),  chromosomal anomalies and fetal infections.  Patient did not  give history of fever or rashes.  I explained that only amniocentesis will give a definitive result  on the fetal karyotype and some genetic conditions  (Microarray).  I explained amniocentesis procedure and  possible complication of preterm delivery (1 and 500  procedures). Patient opted not to have amniocentesis.  I discussed ultrasound protocol of monitoring fetal growth  restriction.  Timing of delivery: Since small for gestational age fetuses  have a higher chance of having perinatal mortality and  morbidity, delivery at 32 to [redacted] weeks gestation is reasonable.  If, however, severe fetal growth restriction is seen with normal  antenatal testing, we will recommend delivery at [redacted] weeks  gestation. ---------------------------------------------------------------------- Recommendations  -Weekly BPP, NST and UA Doppler studies till delivery.  -Fetal growth assessment in 3 weeks.  -NST may be performed at your office between our visits. 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 Dalbert Garnet notified of assessment findings and discussed plan of care - Magnesium sulfate for maternal seizure prophylaxis, 4g bolus followed by 2g/hr - Repeat CBC, CMP in the AM - Consult MFM in the AM - Monitor for evidence of end-organ damage, such as elevated  liver enzymes, increased creatinine, or severe range blood pressures - Labetalol protocol for severe-range blood pressures - Continuous fetal monitoring - Plan expectant management until 34wks or worsening maternal or fetal status, whichever occurs first  30 weeks pregnancy: - Admit to Antenatal - Betamethasone x 2 doses - Magnesium sulfate for seizure prophylaxis/CP prophylaxis - NICU notified of admission and capable to accept 30wk IUGR infant - Will recheck presentation if she does progress in preterm labor to determine route of delivery, breech on MFM [place] 10/31/23. - Routine antenatal care - GBS swab collected  Severe IUGR: - IUGR is no longer a criteria for delivery for pre-eclampsia with severe features - EFW 937g (<1%) - NICU aware - BPP 10/10 on 10/31/23 - Normal fetal dopplers on 10/31/23 - Consult MFM for antenatal testing recommendations while in the hospital - Normal MaterniT21, amniocentesis declined   Janyce Llanos, CNM 11/03/2023 11:37 PM

## 2023-11-03 NOTE — Progress Notes (Signed)
 Assuming care for pt. IV placed due to high BP upon arrival in the emergency of needing IV access. PIH labs sent. Andrey Campanile, CNM aware of pt condition. Yulisa Chirico B. Yakira Duquette, RN BSN 11/03/2023 9:57 PM

## 2023-11-04 LAB — COMPREHENSIVE METABOLIC PANEL
ALT: 10 U/L (ref 0–44)
AST: 15 U/L (ref 15–41)
Albumin: 3 g/dL — ABNORMAL LOW (ref 3.5–5.0)
Alkaline Phosphatase: 128 U/L — ABNORMAL HIGH (ref 38–126)
Anion gap: 8 (ref 5–15)
BUN: 10 mg/dL (ref 6–20)
CO2: 21 mmol/L — ABNORMAL LOW (ref 22–32)
Calcium: 8.1 mg/dL — ABNORMAL LOW (ref 8.9–10.3)
Chloride: 105 mmol/L (ref 98–111)
Creatinine, Ser: 0.51 mg/dL (ref 0.44–1.00)
GFR, Estimated: >60 mL/min (ref >60)
Glucose, Bld: 110 mg/dL — ABNORMAL HIGH (ref 70–99)
Potassium: 3.9 mmol/L (ref 3.5–5.1)
Sodium: 134 mmol/L — ABNORMAL LOW (ref 135–145)
Total Bilirubin: 0.4 mg/dL (ref 0.0–1.2)
Total Protein: 6.2 g/dL — ABNORMAL LOW (ref 6.5–8.1)

## 2023-11-04 LAB — CBC
HCT: 31.8 % — ABNORMAL LOW (ref 36.0–46.0)
Hemoglobin: 11.2 g/dL — ABNORMAL LOW (ref 12.0–15.0)
MCH: 30.3 pg (ref 26.0–34.0)
MCHC: 35.2 g/dL (ref 30.0–36.0)
MCV: 85.9 fL (ref 80.0–100.0)
Platelets: 203 10*3/uL (ref 150–400)
RBC: 3.7 MIL/uL — ABNORMAL LOW (ref 3.87–5.11)
RDW: 12.5 % (ref 11.5–15.5)
WBC: 7.9 10*3/uL (ref 4.0–10.5)
nRBC: 0 % (ref 0.0–0.2)

## 2023-11-04 LAB — GROUP B STREP BY PCR: Group B strep by PCR: POSITIVE — AB

## 2023-11-04 LAB — MAGNESIUM: Magnesium: 5.8 mg/dL — ABNORMAL HIGH (ref 1.7–2.4)

## 2023-11-04 LAB — RPR: RPR Ser Ql: NONREACTIVE

## 2023-11-04 LAB — TYPE AND SCREEN
ABO/RH(D): O POS
Antibody Screen: NEGATIVE

## 2023-11-04 MED ORDER — ZOLPIDEM TARTRATE 5 MG PO TABS
5.0000 mg | ORAL_TABLET | Freq: Once | ORAL | Status: AC
  Administered 2023-11-04: 5 mg via ORAL
  Filled 2023-11-04: qty 1

## 2023-11-04 NOTE — Progress Notes (Signed)
 Antepartum Progress note  Stefanie Waters is a 23 y.o. G2P1001 at [redacted]w[redacted]d who is admitted for pre-eclampsia with severe features.  Estimated Date of Delivery: 01/09/24  Length of Stay:  1 Days. Admitted 11/03/2023  Subjective: She reports she still has a headache. It initially improved but then returned. Continuing to have mild blurry vision. Denies RUQ pain. Patient reports good fetal movement.  She reports no uterine contractions, no bleeding and no loss of fluid per vagina.  Vitals:  Blood pressure 132/81, pulse 91, temperature 99 F (37.2 C), temperature source Oral, resp. rate 14, height 5\' 6"  (1.676 m), weight 70.8 kg, last menstrual period 04/04/2023, SpO2 100%. Vitals:   11/03/23 2251 11/03/23 2322 11/03/23 2336 11/03/23 2351  BP: (!) 147/91 138/87 134/83 137/83   11/04/23 0006 11/04/23 0030 11/04/23 0131 11/04/23 0242  BP: (!) 143/94 138/86 134/84 120/77   11/04/23 0351 11/04/23 0433 11/04/23 0534 11/04/23 0631  BP: 126/71 126/82 136/84 132/81    Physical Examination: CONSTITUTIONAL: Well-developed, well-nourished female in no acute distress.  HENT:  Normocephalic, atraumatic, External right and left ear normal. Oropharynx is clear and moist EYES: Conjunctivae and EOM are normal. Pupils are equal, round, and reactive to light. No scleral icterus.  SKIN: Skin is warm and dry. No rash noted. Not diaphoretic. No erythema. No pallor. NEUROLGIC: Alert and oriented to person, place, and time. hyper reflexes, no clonus, muscle tone coordination. No cranial nerve deficit noted. PSYCHIATRIC: Normal mood and affect. Normal behavior. Normal judgment and thought content. CARDIOVASCULAR: Normal heart rate noted, regular rhythm RESPIRATORY: Effort and breath sounds normal, no problems with respiration noted MUSCULOSKELETAL: Normal range of motion. No edema and no tenderness. 2+ distal pulses. ABDOMEN: Soft, nontender, nondistended, gravid.  Fetal monitoring: FHR: 130 bpm, Variability:  moderate, Accelerations: Present, Decelerations: Absent  Uterine activity: Rare contractions   Results for orders placed or performed during the hospital encounter of 11/03/23 (from the past 48 hours)  Comprehensive metabolic panel     Status: Abnormal   Collection Time: 11/03/23  9:32 PM  Result Value Ref Range   Sodium 134 (L) 135 - 145 mmol/L   Potassium 3.7 3.5 - 5.1 mmol/L   Chloride 105 98 - 111 mmol/L   CO2 20 (L) 22 - 32 mmol/L   Glucose, Bld 81 70 - 99 mg/dL    Comment: Glucose reference range applies only to samples taken after fasting for at least 8 hours.   BUN 11 6 - 20 mg/dL   Creatinine, Ser 9.51 0.44 - 1.00 mg/dL   Calcium 8.6 (L) 8.9 - 10.3 mg/dL   Total Protein 6.3 (L) 6.5 - 8.1 g/dL   Albumin 2.9 (L) 3.5 - 5.0 g/dL   AST 19 15 - 41 U/L   ALT 9 0 - 44 U/L   Alkaline Phosphatase 116 38 - 126 U/L   Total Bilirubin 0.6 0.0 - 1.2 mg/dL   GFR, Estimated >88 >41 mL/min    Comment: (NOTE) Calculated using the CKD-EPI Creatinine Equation (2021)    Anion gap 9 5 - 15    Comment: Performed at [facility], [address] [place], [place] [address]  CBC     Status: Abnormal   Collection Time: 11/03/23  9:32 PM  Result Value Ref Range   WBC 7.0 4.0 - 10.5 K/uL   RBC 3.46 (L) 3.87 - 5.11 MIL/uL   Hemoglobin 10.7 (L) 12.0 - 15.0 g/dL   HCT 01.6 (L) 01.0 - 93.2 %   MCV  87.0 80.0 - 100.0 fL   MCH 30.9 26.0 - 34.0 pg   MCHC 35.5 30.0 - 36.0 g/dL   RDW 16.1 09.6 - 04.5 %   Platelets 203 150 - 400 K/uL   nRBC 0.0 0.0 - 0.2 %    Comment: Performed at [facility], [address] [place], [place] [address]  Protein / creatinine ratio, urine     Status: Abnormal   Collection Time: 11/03/23  9:32 PM  Result Value Ref Range   Creatinine, Urine 126 mg/dL   Total Protein, Urine 252 mg/dL    Comment: RESULT CONFIRMED BY MANUAL DILUTION skl NO NORMAL RANGE ESTABLISHED FOR THIS TEST    Protein Creatinine Ratio 2.00 (H) 0.00 - 0.15 mg/mg[Cre]    Comment:  Performed at [facility], [address] [place], [place] [address]  Group B strep by PCR     Status: Abnormal   Collection Time: 11/03/23 11:04 PM   Specimen: Vaginal/Rectal; Genital  Result Value Ref Range   Group B strep by PCR POSITIVE (A) PRESUMPTIVE NEGATIVE    Comment: (NOTE) Intrapartum testing with Xpert GBS assay should be used as an adjunct to other methods available and not used to replace antepartum testing (at 35-[redacted] weeks gestation). Performed at [facility], [address] [place], [place] [address]   Type and screen [facility]     Status: None   Collection Time: 11/03/23 11:12 PM  Result Value Ref Range   ABO/RH(D) O POS    Antibody Screen NEG    Sample Expiration      11/06/2023,2359 Performed at [facility], [address] [place], [place] [address]   Comprehensive metabolic panel     Status: Abnormal   Collection Time: 11/04/23  4:11 AM  Result Value Ref Range   Sodium 134 (L) 135 - 145 mmol/L   Potassium 3.9 3.5 - 5.1 mmol/L   Chloride 105 98 - 111 mmol/L   CO2 21 (L) 22 - 32 mmol/L   Glucose, Bld 110 (H) 70 - 99 mg/dL    Comment: Glucose reference range applies only to samples taken after fasting for at least 8 hours.   BUN 10 6 - 20 mg/dL   Creatinine, Ser 6.57 0.44 - 1.00 mg/dL   Calcium 8.1 (L) 8.9 - 10.3 mg/dL   Total Protein 6.2 (L) 6.5 - 8.1 g/dL   Albumin 3.0 (L) 3.5 - 5.0 g/dL   AST 15 15 - 41 U/L   ALT 10 0 - 44 U/L   Alkaline Phosphatase 128 (H) 38 - 126 U/L   Total Bilirubin 0.4 0.0 - 1.2 mg/dL   GFR, Estimated >84 >69 mL/min    Comment: (NOTE) Calculated using the CKD-EPI Creatinine Equation (2021)    Anion gap 8 5 - 15    Comment: Performed at [facility], [address] [place], [place] [address]  CBC     Status: Abnormal   Collection Time: 11/04/23  4:11 AM  Result Value Ref Range   WBC 7.9 4.0 - 10.5 K/uL   RBC 3.70 (L) 3.87 - 5.11 MIL/uL   Hemoglobin 11.2 (L)  12.0 - 15.0 g/dL   HCT 84.1 (L) 32.4 - 40.1 %   MCV 85.9 80.0 - 100.0 fL   MCH 30.3 26.0 - 34.0 pg   MCHC 35.2 30.0 - 36.0 g/dL   RDW 02.7 25.3 - 66.4 %   Platelets 203 150 - 400 K/uL  nRBC 0.0 0.0 - 0.2 %    Comment: Performed at [facility], [address] [place], [place] [address]    No results found.  Current scheduled medications  betamethasone acetate-betamethasone sodium phosphate  12 mg Intramuscular Q24 Hr x 2   docusate sodium  100 mg Oral Daily   oxytocin 40 units in LR 1000 mL  333 mL Intravenous Once    I have reviewed the patient's current medications.  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

## 2023-11-04 NOTE — Progress Notes (Signed)
 Patient ID: Stefanie Waters, female   DOB: 2000-12-13, 23 y.o.   MRN: [id number] Chart reviewed in concert with CNM Liboon . Working dx of preeclampsia with severe features  based on initial BP and headache and proteinuria. IUGR < 1% with normal dopplers on 10/31/23 by MFM   She has not required a BP meds and has not had a h/a ( no meds)  She is on Magnesium 2 gm / hr  and her second steroid shot of Betamethasone is due at 2330 .  Fluid now restricted to 3000 cc/ 24 hr   Labs otherwise appear nl  O; 137/ 86  Lungs decreased at bases , no rales EFM : 120-125 + accels . No decels  A/P; preeclampsia with severe features Continue Magnesium until 48 hrs for full effect of steroids.  Dr Grace Bushy has been consulted and has agreed to see her tomorrow .  Repeat labs ordered for am  If fetal or maternal compromise arises proceed to delivery ( C/S if still breech ) Vaginal with success of approx 35% given her gestational age . Tyrosine kinase/ placental growth factor ratio pending  SCD on legs . Continue monitoring UO

## 2023-11-04 NOTE — Progress Notes (Addendum)
 ANTEPARTUM PROGRESS NOTE  Stefanie Waters is a 23 y.o. G2P1001 at [redacted]w[redacted]d who is admitted for  pre-eclampsia with severe features. Estimated Date of Delivery: 01/09/24  Length of Stay:  1 Days. Admitted 11/03/2023  Subjective: She reports she is currently not experiencing any headaches. Denies visual disturbances and RUQ pain. Reports active fetal movement. Denies uterine contractions, vaginal bleeding, and leakage of fluid.   Vitals:  BP 139/85   Pulse 90   Temp 99 F (37.2 C) (Oral)   Resp 15   Ht 5\' 6"  (1.676 m)   Wt 70.8 kg   LMP 04/04/2023   SpO2 100%   BMI 25.18 kg/m   Vitals:   11/03/23 2351 11/04/23 0006 11/04/23 0030 11/04/23 0131  BP: 137/83 (!) 143/94 138/86 134/84   11/04/23 0242 11/04/23 0351 11/04/23 0433 11/04/23 0534  BP: 120/77 126/71 126/82 136/84   11/04/23 0631 11/04/23 0738 11/04/23 0843 11/04/23 0934  BP: 132/81 (!) 140/94 138/86 139/85    Physical Examination: CONSTITUTIONAL: Well-developed, well-nourished female in no acute distress.  HENT:  Normocephalic, atraumatic, facial edema noted EYES: Conjunctivae and EOM are normal.  SKIN: Skin is warm and dry. No rash noted. Not diaphoretic. No erythema. No pallor. NEUROLGIC: Alert and oriented to person, place, and time. No hyper reflexes, no clonus, muscle tone coordination. No cranial nerve deficit noted. PSYCHIATRIC: Normal mood and affect. Normal behavior. Normal judgment and thought content. CARDIOVASCULAR: Normal heart rate noted, regular rhythm RESPIRATORY: Effort and breath sounds normal, no problems with respiration noted MUSCULOSKELETAL: Normal range of motion. No edema and no tenderness. 2+ distal pulses. ABDOMEN: Soft, nontender, nondistended, gravid.  Fetal monitoring: FHR: 125 bpm, Variability: moderate, Accelerations: Present, Decelerations: Absent  Uterine activity: None  Category/reactivity:  Category I   Results for orders placed or performed during the hospital encounter of 11/03/23  (from the past 48 hours)  Comprehensive metabolic panel     Status: Abnormal   Collection Time: 11/03/23  9:32 PM  Result Value Ref Range   Sodium 134 (L) 135 - 145 mmol/L   Potassium 3.7 3.5 - 5.1 mmol/L   Chloride 105 98 - 111 mmol/L   CO2 20 (L) 22 - 32 mmol/L   Glucose, Bld 81 70 - 99 mg/dL    Comment: Glucose reference range applies only to samples taken after fasting for at least 8 hours.   BUN 11 6 - 20 mg/dL   Creatinine, Ser 1.61 0.44 - 1.00 mg/dL   Calcium 8.6 (L) 8.9 - 10.3 mg/dL   Total Protein 6.3 (L) 6.5 - 8.1 g/dL   Albumin 2.9 (L) 3.5 - 5.0 g/dL   AST 19 15 - 41 U/L   ALT 9 0 - 44 U/L   Alkaline Phosphatase 116 38 - 126 U/L   Total Bilirubin 0.6 0.0 - 1.2 mg/dL   GFR, Estimated >09 >60 mL/min    Comment: (NOTE) Calculated using the CKD-EPI Creatinine Equation (2021)    Anion gap 9 5 - 15    Comment: Performed at [facility], [address] [place], [place] [address]  CBC     Status: Abnormal   Collection Time: 11/03/23  9:32 PM  Result Value Ref Range   WBC 7.0 4.0 - 10.5 K/uL   RBC 3.46 (L) 3.87 - 5.11 MIL/uL   Hemoglobin 10.7 (L) 12.0 - 15.0 g/dL   HCT 81.1 (L) 91.4 - 78.2 %   MCV 87.0 80.0 - 100.0 fL   MCH 30.9 26.0 - 34.0  pg   MCHC 35.5 30.0 - 36.0 g/dL   RDW 16.1 09.6 - 04.5 %   Platelets 203 150 - 400 K/uL   nRBC 0.0 0.0 - 0.2 %    Comment: Performed at [facility], [address] [place], [place] [address]  Protein / creatinine ratio, urine     Status: Abnormal   Collection Time: 11/03/23  9:32 PM  Result Value Ref Range   Creatinine, Urine 126 mg/dL   Total Protein, Urine 252 mg/dL    Comment: RESULT CONFIRMED BY MANUAL DILUTION skl NO NORMAL RANGE ESTABLISHED FOR THIS TEST    Protein Creatinine Ratio 2.00 (H) 0.00 - 0.15 mg/mg[Cre]    Comment: Performed at [facility], [address] [place], [place] [address]  Group B strep by PCR     Status: Abnormal   Collection Time: 11/03/23 11:04 PM   Specimen:  Vaginal/Rectal; Genital  Result Value Ref Range   Group B strep by PCR POSITIVE (A) PRESUMPTIVE NEGATIVE    Comment: (NOTE) Intrapartum testing with Xpert GBS assay should be used as an adjunct to other methods available and not used to replace antepartum testing (at 35-[redacted] weeks gestation). Performed at [facility], [address] [place], [place] [address]   Type and screen [facility]     Status: None   Collection Time: 11/03/23 11:12 PM  Result Value Ref Range   ABO/RH(D) O POS    Antibody Screen NEG    Sample Expiration      11/06/2023,2359 Performed at [facility], [address] [place], [place] [address]   RPR     Status: None   Collection Time: 11/03/23 11:12 PM  Result Value Ref Range   RPR Ser Ql NON REACTIVE NON REACTIVE    Comment: Performed at [facility], [address] [place], [place] [address]  Comprehensive metabolic panel     Status: Abnormal   Collection Time: 11/04/23  4:11 AM  Result Value Ref Range   Sodium 134 (L) 135 - 145 mmol/L   Potassium 3.9 3.5 - 5.1 mmol/L   Chloride 105 98 - 111 mmol/L   CO2 21 (L) 22 - 32 mmol/L   Glucose, Bld 110 (H) 70 - 99 mg/dL    Comment: Glucose reference range applies only to samples taken after fasting for at least 8 hours.   BUN 10 6 - 20 mg/dL   Creatinine, Ser 6.96 0.44 - 1.00 mg/dL   Calcium 8.1 (L) 8.9 - 10.3 mg/dL   Total Protein 6.2 (L) 6.5 - 8.1 g/dL   Albumin 3.0 (L) 3.5 - 5.0 g/dL   AST 15 15 - 41 U/L   ALT 10 0 - 44 U/L   Alkaline Phosphatase 128 (H) 38 - 126 U/L   Total Bilirubin 0.4 0.0 - 1.2 mg/dL   GFR, Estimated >29 >52 mL/min    Comment: (NOTE) Calculated using the CKD-EPI Creatinine Equation (2021)    Anion gap 8 5 - 15    Comment: Performed at [facility], [address] [place], [place] [address]  CBC     Status: Abnormal   Collection Time: 11/04/23  4:11 AM  Result Value Ref Range   WBC 7.9 4.0 - 10.5 K/uL   RBC 3.70 (L)  3.87 - 5.11 MIL/uL   Hemoglobin 11.2 (L) 12.0 - 15.0 g/dL   HCT 44.0 (L) 10.2 - 72.5 %   MCV 85.9 80.0 - 100.0 fL  MCH 30.3 26.0 - 34.0 pg   MCHC 35.2 30.0 - 36.0 g/dL   RDW 86.5 78.4 - 69.6 %   Platelets 203 150 - 400 K/uL   nRBC 0.0 0.0 - 0.2 %    Comment: Performed at [facility], [address] [place], [place] [address]    Current scheduled medications  betamethasone acetate-betamethasone sodium phosphate  12 mg Intramuscular Q24 Hr x 2   docusate sodium  100 mg Oral Daily   oxytocin 40 units in LR 1000 mL  333 mL Intravenous Once    I have reviewed the patient's current medications.  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 - Continue magnesium sulfate 2g/hr  - Started at 2331 on 03/24 - Continuous fetal monitoring - NICU consult ordered - Tyelenol 650 mg PO q4hrn prn for headaches  - Tyrosine kinase 1 (sFlt-1)/placental growth factor (PIGF) ratio test collected     (03/25/205); Results: Pending - Magnesium collected (11/04/2023); Results: Pending - Plan to receive 2nd dose of BMZ today (03/25) @ 2345 - Blood Pressures range from 126-140/79-94  - Antihypertensive protocol in place  - GBS: Positive  - MFM consults complete with Grace Bushy, MD (11/04/2023); Recommendations as follows:  Grace Bushy, MD will plan to assess patient at beside in AM (11/05/2023)  - Weekly Dopplers  - Weekly labs  - NST BID  Roney Jaffe, CNM 11/04/2023 10:36 AM  Roney Jaffe Certified Nurse Midwife [facility]

## 2023-11-05 DIAGNOSIS — O0993 Supervision of high risk pregnancy, unspecified, third trimester: Secondary | ICD-10-CM | POA: Insufficient documentation

## 2023-11-05 DIAGNOSIS — O365930 Maternal care for other known or suspected poor fetal growth, third trimester, not applicable or unspecified: Secondary | ICD-10-CM

## 2023-11-05 DIAGNOSIS — Z3A30 30 weeks gestation of pregnancy: Secondary | ICD-10-CM

## 2023-11-05 DIAGNOSIS — O321XX0 Maternal care for breech presentation, not applicable or unspecified: Secondary | ICD-10-CM | POA: Diagnosis present

## 2023-11-05 DIAGNOSIS — B951 Streptococcus, group B, as the cause of diseases classified elsewhere: Secondary | ICD-10-CM | POA: Diagnosis present

## 2023-11-05 DIAGNOSIS — O1413 Severe pre-eclampsia, third trimester: Principal | ICD-10-CM

## 2023-11-05 LAB — COMPREHENSIVE METABOLIC PANEL
ALT: 10 U/L (ref 0–44)
AST: 17 U/L (ref 15–41)
Albumin: 2.6 g/dL — ABNORMAL LOW (ref 3.5–5.0)
Alkaline Phosphatase: 106 U/L (ref 38–126)
Anion gap: 5 (ref 5–15)
BUN: 8 mg/dL (ref 6–20)
CO2: 20 mmol/L — ABNORMAL LOW (ref 22–32)
Calcium: 6.7 mg/dL — ABNORMAL LOW (ref 8.9–10.3)
Chloride: 109 mmol/L (ref 98–111)
Creatinine, Ser: 0.57 mg/dL (ref 0.44–1.00)
GFR, Estimated: >60 mL/min (ref >60)
Glucose, Bld: 124 mg/dL — ABNORMAL HIGH (ref 70–99)
Potassium: 4.3 mmol/L (ref 3.5–5.1)
Sodium: 134 mmol/L — ABNORMAL LOW (ref 135–145)
Total Bilirubin: 0.3 mg/dL (ref 0.0–1.2)
Total Protein: 5.7 g/dL — ABNORMAL LOW (ref 6.5–8.1)

## 2023-11-05 LAB — MAGNESIUM: Magnesium: 6.5 mg/dL (ref 1.7–2.4)

## 2023-11-05 LAB — CBC
HCT: 27.1 % — ABNORMAL LOW (ref 36.0–46.0)
Hemoglobin: 9.5 g/dL — ABNORMAL LOW (ref 12.0–15.0)
MCH: 31 pg (ref 26.0–34.0)
MCHC: 35.1 g/dL (ref 30.0–36.0)
MCV: 88.6 fL (ref 80.0–100.0)
Platelets: 202 10*3/uL (ref 150–400)
RBC: 3.06 MIL/uL — ABNORMAL LOW (ref 3.87–5.11)
RDW: 12.5 % (ref 11.5–15.5)
WBC: 9.4 10*3/uL (ref 4.0–10.5)
nRBC: 0 % (ref 0.0–0.2)

## 2023-11-05 MED ORDER — FERROUS SULFATE 325 (65 FE) MG PO TABS: 325.0000 mg | ORAL_TABLET | Freq: Two times a day (BID) | ORAL | Status: DC

## 2023-11-05 MED ORDER — LACTATED RINGERS IV SOLN: INTRAVENOUS | Status: DC

## 2023-11-05 NOTE — Progress Notes (Signed)
 RN provided report to [facility] at 1905.

## 2023-11-05 NOTE — Progress Notes (Signed)
 ANTEPARTUM PROGRESS NOTE  Stefanie Waters is a 23 y.o. G2P1001 at [redacted]w[redacted]d with 01/09/2024, by Last Menstrual Period  who is admitted for preeclampsia with severe features. Britzy initially presented to [facility] triage on 11/03/2023 with complaints of swelling in face and ankles, headache not relieved with tylenol, and visual changes with spots. She had severe range blood pressures followed by mild range. CBC and CMP were WNL, urine PCR was elevated at 2.0. Mag sulfate infusion was started for seizure prophylaxis and 2 injections of betamethasone was given (last dose at 11/04/23 at 2336). MFM evaluation is pending for today. 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    Length of Stay:  2 Days. Admitted 11/03/2023  Subjective: Currently denies headache, visual changes, or RUQ pain. Denies contractions, cramping, vaginal bleeding, or LOF. Endorses good fetal movement.   Vitals:  BP 138/82 (BP Location: Left Arm) Comment: just got back from [facility]  Pulse (!) 106   Temp 98.1 F (36.7 C) (Oral)   Resp 18   Ht 5\' 6"  (1.676 m)   Wt 70.8 kg   LMP 04/04/2023   SpO2 100%   BMI 25.18 kg/m  Physical Examination: General:   alert, cooperative, and no distress  Skin:  normal  Neurologic:    Alert & oriented x 3  Lungs:   clear to auscultation bilaterally, normal respiratory effort   Abdomen:   Gravid, soft, nontender   Pelvis:  Exam deferred.  FHT:  125 BPM  Presentations: breech  Extremities: : non-tender, symmetric, Mild, dependent edema bilaterally.  DTRs: 3+/2+, negative for clonus     NST: Baseline FHR: 125 beats/min Variability: minimal  Accelerations: present 10 x 10  Decelerations: absent Tocometry: None  Interpretation: Category II for minimal variability  INDICATIONS: Preeclampsia with severe features, severe  IUGR   Results for orders placed or performed during the hospital encounter of 11/03/23 (from the past 48 hours)  Comprehensive metabolic panel     Status: Abnormal   Collection Time: 11/03/23  9:32 PM  Result Value Ref Range   Sodium 134 (L) 135 - 145 mmol/L   Potassium 3.7 3.5 - 5.1 mmol/L   Chloride 105 98 - 111 mmol/L   CO2 20 (L) 22 - 32 mmol/L   Glucose, Bld 81 70 - 99 mg/dL    Comment: Glucose reference range applies only to samples taken after fasting for at least 8 hours.   BUN 11 6 - 20 mg/dL   Creatinine, Ser 7.62 0.44 - 1.00 mg/dL   Calcium 8.6 (L) 8.9 - 10.3 mg/dL   Total Protein 6.3 (L) 6.5 - 8.1 g/dL   Albumin 2.9 (L) 3.5 - 5.0 g/dL   AST 19 15 - 41 U/L   ALT 9 0 - 44 U/L   Alkaline Phosphatase 116 38 - 126 U/L   Total Bilirubin 0.6 0.0 - 1.2 mg/dL   GFR, Estimated >83 >15 mL/min    Comment: (NOTE) Calculated using the CKD-EPI Creatinine Equation (2021)    Anion gap 9 5 - 15    Comment: Performed at [facility], [address] [place], [place] [address]  CBC     Status: Abnormal   Collection Time: 11/03/23  9:32 PM  Result Value Ref Range   WBC 7.0 4.0 - 10.5 K/uL   RBC 3.46 (L) 3.87 - 5.11 MIL/uL  Hemoglobin 10.7 (L) 12.0 - 15.0 g/dL   HCT 91.4 (L) 78.2 - 95.6 %   MCV 87.0 80.0 - 100.0 fL   MCH 30.9 26.0 - 34.0 pg   MCHC 35.5 30.0 - 36.0 g/dL   RDW 21.3 08.6 - 57.8 %   Platelets 203 150 - 400 K/uL   nRBC 0.0 0.0 - 0.2 %    Comment: Performed at [facility], [address] [place], [place] [address]  Protein / creatinine ratio, urine     Status: Abnormal   Collection Time: 11/03/23  9:32 PM  Result Value Ref Range   Creatinine, Urine 126 mg/dL   Total Protein, Urine 252 mg/dL    Comment: RESULT CONFIRMED BY MANUAL DILUTION skl NO NORMAL RANGE ESTABLISHED FOR THIS TEST    Protein Creatinine Ratio 2.00 (H) 0.00 - 0.15 mg/mg[Cre]    Comment: Performed at [facility], [address] [place], [place] [address]  Group B  strep by PCR     Status: Abnormal   Collection Time: 11/03/23 11:04 PM   Specimen: Vaginal/Rectal; Genital  Result Value Ref Range   Group B strep by PCR POSITIVE (A) PRESUMPTIVE NEGATIVE    Comment: (NOTE) Intrapartum testing with Xpert GBS assay should be used as an adjunct to other methods available and not used to replace antepartum testing (at 35-[redacted] weeks gestation). Performed at [facility], [address] [place], [place] [address]   Type and screen [facility]     Status: None   Collection Time: 11/03/23 11:12 PM  Result Value Ref Range   ABO/RH(D) O POS    Antibody Screen NEG    Sample Expiration      11/06/2023,2359 Performed at [facility], [address] [place], [place] [address]   RPR     Status: None   Collection Time: 11/03/23 11:12 PM  Result Value Ref Range   RPR Ser Ql NON REACTIVE NON REACTIVE    Comment: Performed at [facility], [address] [place], [place] [address]  Comprehensive metabolic panel     Status: Abnormal   Collection Time: 11/04/23  4:11 AM  Result Value Ref Range   Sodium 134 (L) 135 - 145 mmol/L   Potassium 3.9 3.5 - 5.1 mmol/L   Chloride 105 98 - 111 mmol/L   CO2 21 (L) 22 - 32 mmol/L   Glucose, Bld 110 (H) 70 - 99 mg/dL    Comment: Glucose reference range applies only to samples taken after fasting for at least 8 hours.   BUN 10 6 - 20 mg/dL   Creatinine, Ser 4.40 0.44 - 1.00 mg/dL   Calcium 8.1 (L) 8.9 - 10.3 mg/dL   Total Protein 6.2 (L) 6.5 - 8.1 g/dL   Albumin 3.0 (L) 3.5 - 5.0 g/dL   AST 15 15 - 41 U/L   ALT 10 0 - 44 U/L   Alkaline Phosphatase 128 (H) 38 - 126 U/L   Total Bilirubin 0.4 0.0 - 1.2 mg/dL   GFR, Estimated >34 >74 mL/min    Comment: (NOTE) Calculated using the CKD-EPI Creatinine Equation (2021)    Anion gap 8 5 - 15    Comment: Performed at [facility], [address] [place], [place] [address]  CBC     Status: Abnormal   Collection Time:  11/04/23  4:11 AM  Result Value Ref Range   WBC 7.9 4.0 - 10.5 K/uL   RBC 3.70 (  L) 3.87 - 5.11 MIL/uL   Hemoglobin 11.2 (L) 12.0 - 15.0 g/dL   HCT 69.6 (L) 29.5 - 28.4 %   MCV 85.9 80.0 - 100.0 fL   MCH 30.3 26.0 - 34.0 pg   MCHC 35.2 30.0 - 36.0 g/dL   RDW 13.2 44.0 - 10.2 %   Platelets 203 150 - 400 K/uL   nRBC 0.0 0.0 - 0.2 %    Comment: Performed at [facility], [address] [place], [place] [address]  Magnesium     Status: Abnormal   Collection Time: 11/04/23  1:34 PM  Result Value Ref Range   Magnesium 5.8 (H) 1.7 - 2.4 mg/dL    Comment: Performed at [facility], [address] [place], [place] [address]  Magnesium     Status: Abnormal   Collection Time: 11/05/23  5:04 AM  Result Value Ref Range   Magnesium 6.5 (HH) 1.7 - 2.4 mg/dL    Comment: CRITICAL RESULT CALLED TO, READ BACK BY AND VERIFIED WITH  CHIKA OKAFOR AT 0554 11/05/23 JG Performed at [facility], [address] [place], [place] [address]   CBC     Status: Abnormal   Collection Time: 11/05/23  5:04 AM  Result Value Ref Range   WBC 9.4 4.0 - 10.5 K/uL   RBC 3.06 (L) 3.87 - 5.11 MIL/uL   Hemoglobin 9.5 (L) 12.0 - 15.0 g/dL   HCT 95.6 (L) 38.7 - 56.4 %   MCV 88.6 80.0 - 100.0 fL   MCH 31.0 26.0 - 34.0 pg   MCHC 35.1 30.0 - 36.0 g/dL   RDW 33.2 95.1 - 88.4 %   Platelets 202 150 - 400 K/uL   nRBC 0.0 0.0 - 0.2 %    Comment: Performed at [facility], [address] [place], [place] [address]  Comprehensive metabolic panel     Status: Abnormal   Collection Time: 11/05/23  5:04 AM  Result Value Ref Range   Sodium 134 (L) 135 - 145 mmol/L   Potassium 4.3 3.5 - 5.1 mmol/L   Chloride 109 98 - 111 mmol/L   CO2 20 (L) 22 - 32 mmol/L   Glucose, Bld 124 (H) 70 - 99 mg/dL    Comment: Glucose reference range applies only to samples taken after fasting for at least 8 hours.   BUN 8 6 - 20 mg/dL   Creatinine, Ser 3.01 0.44 - 1.00 mg/dL   Calcium 6.7 (L) 8.9 - 10.3  mg/dL   Total Protein 5.7 (L) 6.5 - 8.1 g/dL   Albumin 2.6 (L) 3.5 - 5.0 g/dL   AST 17 15 - 41 U/L   ALT 10 0 - 44 U/L   Alkaline Phosphatase 106 38 - 126 U/L   Total Bilirubin 0.3 0.0 - 1.2 mg/dL   GFR, Estimated >60 >10 mL/min    Comment: (NOTE) Calculated using the CKD-EPI Creatinine Equation (2021)    Anion gap 5 5 - 15    Comment: Performed at [facility], [address] [place], [place] [address]    Intake/Output Summary (Last 24 hours) at 11/05/2023 1037 Last data filed at 11/05/2023 1030 Gross per 24 hour  Intake 3434.61 ml  Output 2500 ml  Net 934.61 ml    No results found.  Current scheduled medications  docusate sodium  100 mg Oral Daily   oxytocin 40 units in LR 1000 mL  333 mL Intravenous Once    I have reviewed the patient's  current medications.  ASSESSMENT: Principal Problem:   Preeclampsia, severe, third trimester Active Problems:   Anemia during pregnancy   Fetal growth restriction antepartum   Headache in pregnancy   Positive GBS test   Breech presentation    PLAN: High risk antepartum  -Inpatient for Preeclampsia with severe features  -Dr Virgel Manifold MD notified of plan of care  -Continue routine antepartum care  -Continuous fetal monitoring  -Regular diet -Activity: Bedrest, up to bedside commode  -NICU consult requested  -MFM consult pending today   Preeclampsia with severe features -Headache has resolved since admission -Reports intermittently blurred vision and spots when the room lights are on. Currently not having visual symptoms -Denies RUQ pain  -Mag sulfate infusion started 11/03/2023 at 2330. Plan to continue for 48 hours -I/O balanced  -Preeclampsia labs as follows: --Platelets 203->203->202 --Serum Creatinine: 0.55->0.51->0.57 --AST/ALT: 19/9->15/10->17/10 --Urine PCR: 2.00 -24 hour urine started today  -Goals for delivery is 34 weeks unless sooner delivery is indicated d/t worsening preeclampsia or concerning  fetal status. Reviewed that method of delivery would be primary cesarean section if baby remains in breech presentation. Can consider induction of labor if turns to cephalic presentation.   Fetal growth restriction -Followed by MFM for IUGR < 1%tile  -Recent dopplers were WNL, AFI, WNL  -MFM consult pending today  -Cat II tracing for minimal variability - overall reassuring with 10x10 accels, no decels present   GBS Positive -Will need prophylaxis as indicated if able to attempt vaginal delivery   Breech presentation: -Plan for primary c/section when delivery indicated  -Will check presentation prior to confirm route of delivery   Anemia in pregnancy  -Start on oral iron supplements    Margaretmary Eddy, CNM Certified Nurse Midwife [facility]

## 2023-11-05 NOTE — Consult Note (Signed)
 MFM Consultation  Ms. Campoverde is a 23 yo G2P1 at [redacted]w[redacted]d with an EDD of 01/09/24.   She is seen today on L&D at the request of Jazmine Liboon CNM regarding management of preeclampsia with severe features and severe fetal growth restriction.  Ms. Beaird' overall is doing well today. She reports that since she has been on magnesium sulfate her headache has improved as well as her blood pressure.   She received an MFM consultation on 3/21 by Dr. Noralee Space regarding the new diagnosis of severe FGR < 1%.   She was admitted on  10/31/23 for new onset blood pressures with elevated UPC of 2.0. She presented with blood pressure of 161/100 and repeat 141/90's. Her blood pressures ranged from 137-160/92-100. Ms. Ham also had a persistent headache. She was given magnesium sulfate and has since received a course of BMZ.  Today she had a NICU consultation.     11/05/2023   11:35 AM 11/05/2023   10:35 AM 11/05/2023    9:35 AM  Vitals with BMI  Systolic 139 138 782  Diastolic 83 82 74  Pulse 108 106 98      Latest Ref Rng & Units 11/05/2023    5:04 AM 11/04/2023    4:11 AM 11/03/2023    9:32 PM  CBC  WBC 4.0 - 10.5 K/uL 9.4  7.9  7.0   Hemoglobin 12.0 - 15.0 g/dL 9.5  95.6  21.3   Hematocrit 36.0 - 46.0 % 27.1  31.8  30.1   Platelets 150 - 400 K/uL 202  203  203       Latest Ref Rng & Units 11/05/2023    5:04 AM 11/04/2023    4:11 AM 11/03/2023    9:32 PM  CMP  Glucose 70 - 99 mg/dL 086  578  81   BUN 6 - 20 mg/dL 8  10  11    Creatinine 0.44 - 1.00 mg/dL 4.69  6.29  5.28   Sodium 135 - 145 mmol/L 134  134  134   Potassium 3.5 - 5.1 mmol/L 4.3  3.9  3.7   Chloride 98 - 111 mmol/L 109  105  105   CO2 22 - 32 mmol/L 20  21  20    Calcium 8.9 - 10.3 mg/dL 6.7  8.1  8.6   Total Protein 6.5 - 8.1 g/dL 5.7  6.2  6.3   Total Bilirubin 0.0 - 1.2 mg/dL 0.3  0.4  0.6   Alkaline Phos 38 - 126 U/L 106  128  116   AST 15 - 41 U/L 17  15  19    ALT 0 - 44 U/L 10  10  9     OB History  Gravida Para  Term Preterm AB Living  2 1 1  0 0 1  SAB IAB Ectopic Multiple Live Births  0 0 0 0 1    # Outcome Date GA Lbr Len/2nd Weight Sex Type Anes PTL Lv  2 Current           1 Term 12/03/19 [redacted]w[redacted]d 08:20 / 01:03 2590 g F Vag-Spont EPI  LIV     Name: Calamia,GIRL Tanaysha     Apgar1: 8  Apgar5: 9   Past Medical History:  Diagnosis Date   Abdominal pain affecting pregnancy 08/24/2019   Seen at [facility] 08/24/19     Anemia    History   Anxiety    Biological false positive RPR test on 09/29/19 10/07/2019   Treponemal ab  negative     Depressed mood 08/27/2019   08/27/19: referred to [facility]     Headache in pregnancy 11/03/2023   Insufficient prenatal care in second trimester 08/27/2019   Missed prenatal appts between 06/14/19 and 08/27/19  Assessed by [facility] for needs/barriers. None identified.      Normal labor 12/03/2019   Past Surgical History:  Procedure Laterality Date   NO PAST SURGERIES     no surgical hx     Family History  Problem Relation Age of Onset   Diabetes Maternal Grandmother    Heart attack Maternal Uncle        Uncles son died of Heart Attack also   Social History   Socioeconomic History   Marital status: Single    Spouse name: Not on file   Number of children: Not on file   Years of education: 12   Highest education level: Not on file  Occupational History   Not on file  Tobacco Use   Smoking status: Never   Smokeless tobacco: Never  Vaping Use   Vaping status: Never Used  Substance and Sexual Activity   Alcohol use: Not Currently   Drug use: Not Currently    Types: Marijuana    Comment: Quit 2018   Sexual activity: Yes    Partners: Male  Other Topics Concern   Not on file  Social History Narrative   Not on file   Social Drivers of Health   Financial Resource Strain: Low Risk  (03/03/2023)   Received from [facility]   Overall Financial Resource Strain (CARDIA)    Difficulty of Paying Living Expenses: Not hard at all  Food  Insecurity: No Food Insecurity (11/03/2023)   Hunger Vital Sign    Worried About Running Out of Food in the Last Year: Never true    Ran Out of Food in the Last Year: Never true  Transportation Needs: No Transportation Needs (11/03/2023)   PRAPARE - Administrator, Civil Service (Medical): No    Lack of Transportation (Non-Medical): No  Physical Activity: Not on file  Stress: Not on file  Social Connections: Not on file  Intimate Partner Violence: Not At Risk (11/03/2023)   Humiliation, Afraid, Rape, and Kick questionnaire    Fear of Current or Ex-Partner: No    Emotionally Abused: No    Physically Abused: No    Sexually Abused: No    Current Facility-Administered Medications (Endocrine & Metabolic):    oxytocin (PITOCIN) IV BOLUS FROM BAG*   oxytocin (PITOCIN) IV infusion 30 units in NS 500 mL - Premix   Current Facility-Administered Medications (Cardiovascular):    labetalol (NORMODYNE) injection 20 mg **AND** labetalol (NORMODYNE) injection 40 mg **AND** labetalol (NORMODYNE) injection 80 mg **AND** hydrALAZINE (APRESOLINE) injection 10 mg **AND** Measure blood pressure     Current Facility-Administered Medications (Analgesics):    acetaminophen (TYLENOL) tablet 650 mg   lidocaine (PF) (XYLOCAINE) 1 % injection 30 mL   Current Facility-Administered Medications (Hematological):    ferrous sulfate tablet 325 mg   Current Facility-Administered Medications (Other):    calcium carbonate (TUMS - dosed in mg elemental calcium) chewable tablet 400 mg of elemental calcium   docusate sodium (COLACE) capsule 100 mg   lactated ringers infusion   magnesium sulfate 40 grams in SWI 1000 mL OB infusion   ondansetron (ZOFRAN) injection 4 mg   oxytocin (PITOCIN) IV BOLUS FROM BAG*   sodium citrate-citric acid (ORACIT) solution 30 mL  *  These medications belong to multiple therapeutic classes and are listed under each applicable group. No current outpatient medications on  file. No Known Allergies   No imaging performed today.  Impression/Counseling:  Preeclampsia with severe features (headache, BP).   I discussed with Ms. Daoud the diagnosis, evaluation and management of preeclampsia with severe features. We also discussed the complications that can include eclampsia, stroke, and stillbirth.  We discussed the mainstay of management is inpatient surveillance to include 2x weekly BPP daily NST with repeat growth every 3 weeks. .   Criteria to exclude expectant management include" - HELLP Syndrome -Eclampsia- Persistent HA refractory to treatment -Epigastric pain unresponsive to analgesics -Uncontrolled hypertension not responsive to antihypertensives -pulmonary edema Abnormal fetal testing -persistent reversed end diastolic flow in the UAD. (Please see ACOG practice bulletin  Number 222 for further details).   Secondarily- I discussed the plan for severe FGR in the setting of preeclampsia with severe features. No addition testing should change. UAD can be performed weekly if remains normal but should switch to 2x weekly UAD if abnormal UAD are observed.  Again delivery should occur at 32-34 weeks if absent UAD are seen and delivery should occur within 24 hours if persistent REDF is seen as (You may repeat the REDF within 24-hours to confirm persistence)  Consider a rescue course set of steroids if delivery is > 7 day but before 32 weeks from the last course.  All questions answered.   Novella Olive, MD  I spent 60 minutes with > 50% in face to face consultation and medical record review.

## 2023-11-05 NOTE — Discharge Summary (Signed)
 Patient ID: BEVERELY SUEN MRN: [id number] DOB/AGE: Jun 06, 2001 23 y.o.  Admit date: 11/03/2023 Discharge date: 11/05/2023  Admission Diagnoses: Headache in pregnancy [O26.899, R51.9] Preeclampsia, severe, third trimester [O14.13]   Discharge Diagnoses: Principal Problem:   Preeclampsia, severe, third trimester Active Problems:   Anemia during pregnancy   Fetal growth restriction antepartum   Headache in pregnancy   Positive GBS test   Breech presentation  Prenatal Care Site: [facility]  Prenatal Procedures: NST, ultrasound, and mag sulfate infusion   Consults: Neonatology, Maternal Fetal Medicine  Significant Diagnostic Studies:  Results for orders placed or performed during the hospital encounter of 11/03/23 (from the past week)  Comprehensive metabolic panel   Collection Time: 11/03/23  9:32 PM  Result Value Ref Range   Sodium 134 (L) 135 - 145 mmol/L   Potassium 3.7 3.5 - 5.1 mmol/L   Chloride 105 98 - 111 mmol/L   CO2 20 (L) 22 - 32 mmol/L   Glucose, Bld 81 70 - 99 mg/dL   BUN 11 6 - 20 mg/dL   Creatinine, Ser 3.15 0.44 - 1.00 mg/dL   Calcium 8.6 (L) 8.9 - 10.3 mg/dL   Total Protein 6.3 (L) 6.5 - 8.1 g/dL   Albumin 2.9 (L) 3.5 - 5.0 g/dL   AST 19 15 - 41 U/L   ALT 9 0 - 44 U/L   Alkaline Phosphatase 116 38 - 126 U/L   Total Bilirubin 0.6 0.0 - 1.2 mg/dL   GFR, Estimated >17 >61 mL/min   Anion gap 9 5 - 15  CBC   Collection Time: 11/03/23  9:32 PM  Result Value Ref Range   WBC 7.0 4.0 - 10.5 K/uL   RBC 3.46 (L) 3.87 - 5.11 MIL/uL   Hemoglobin 10.7 (L) 12.0 - 15.0 g/dL   HCT 60.7 (L) 37.1 - 06.2 %   MCV 87.0 80.0 - 100.0 fL   MCH 30.9 26.0 - 34.0 pg   MCHC 35.5 30.0 - 36.0 g/dL   RDW 69.4 85.4 - 62.7 %   Platelets 203 150 - 400 K/uL   nRBC 0.0 0.0 - 0.2 %  Protein / creatinine ratio, urine   Collection Time: 11/03/23  9:32 PM  Result Value Ref Range   Creatinine, Urine 126 mg/dL   Total Protein, Urine 252 mg/dL   Protein Creatinine Ratio  2.00 (H) 0.00 - 0.15 mg/mg[Cre]  Group B strep by PCR   Collection Time: 11/03/23 11:04 PM   Specimen: Vaginal/Rectal; Genital  Result Value Ref Range   Group B strep by PCR POSITIVE (A) PRESUMPTIVE NEGATIVE  RPR   Collection Time: 11/03/23 11:12 PM  Result Value Ref Range   RPR Ser Ql NON REACTIVE NON REACTIVE  Type and screen [facility]   Collection Time: 11/03/23 11:12 PM  Result Value Ref Range   ABO/RH(D) O POS    Antibody Screen NEG    Sample Expiration      11/06/2023,2359 Performed at [facility], [address] [place], [place] [address]   Comprehensive metabolic panel   Collection Time: 11/04/23  4:11 AM  Result Value Ref Range   Sodium 134 (L) 135 - 145 mmol/L   Potassium 3.9 3.5 - 5.1 mmol/L   Chloride 105 98 - 111 mmol/L   CO2 21 (L) 22 - 32 mmol/L   Glucose, Bld 110 (H) 70 - 99 mg/dL   BUN 10 6 - 20 mg/dL   Creatinine, Ser 9.38 0.44 - 1.00 mg/dL  Calcium 8.1 (L) 8.9 - 10.3 mg/dL   Total Protein 6.2 (L) 6.5 - 8.1 g/dL   Albumin 3.0 (L) 3.5 - 5.0 g/dL   AST 15 15 - 41 U/L   ALT 10 0 - 44 U/L   Alkaline Phosphatase 128 (H) 38 - 126 U/L   Total Bilirubin 0.4 0.0 - 1.2 mg/dL   GFR, Estimated >16 >10 mL/min   Anion gap 8 5 - 15  CBC   Collection Time: 11/04/23  4:11 AM  Result Value Ref Range   WBC 7.9 4.0 - 10.5 K/uL   RBC 3.70 (L) 3.87 - 5.11 MIL/uL   Hemoglobin 11.2 (L) 12.0 - 15.0 g/dL   HCT 96.0 (L) 45.4 - 09.8 %   MCV 85.9 80.0 - 100.0 fL   MCH 30.3 26.0 - 34.0 pg   MCHC 35.2 30.0 - 36.0 g/dL   RDW 11.9 14.7 - 82.9 %   Platelets 203 150 - 400 K/uL   nRBC 0.0 0.0 - 0.2 %  Magnesium   Collection Time: 11/04/23  1:34 PM  Result Value Ref Range   Magnesium 5.8 (H) 1.7 - 2.4 mg/dL  Magnesium   Collection Time: 11/05/23  5:04 AM  Result Value Ref Range   Magnesium 6.5 (HH) 1.7 - 2.4 mg/dL  CBC   Collection Time: 11/05/23  5:04 AM  Result Value Ref Range   WBC 9.4 4.0 - 10.5 K/uL   RBC 3.06 (L) 3.87 - 5.11 MIL/uL    Hemoglobin 9.5 (L) 12.0 - 15.0 g/dL   HCT 56.2 (L) 13.0 - 86.5 %   MCV 88.6 80.0 - 100.0 fL   MCH 31.0 26.0 - 34.0 pg   MCHC 35.1 30.0 - 36.0 g/dL   RDW 78.4 69.6 - 29.5 %   Platelets 202 150 - 400 K/uL   nRBC 0.0 0.0 - 0.2 %  Comprehensive metabolic panel   Collection Time: 11/05/23  5:04 AM  Result Value Ref Range   Sodium 134 (L) 135 - 145 mmol/L   Potassium 4.3 3.5 - 5.1 mmol/L   Chloride 109 98 - 111 mmol/L   CO2 20 (L) 22 - 32 mmol/L   Glucose, Bld 124 (H) 70 - 99 mg/dL   BUN 8 6 - 20 mg/dL   Creatinine, Ser 2.84 0.44 - 1.00 mg/dL   Calcium 6.7 (L) 8.9 - 10.3 mg/dL   Total Protein 5.7 (L) 6.5 - 8.1 g/dL   Albumin 2.6 (L) 3.5 - 5.0 g/dL   AST 17 15 - 41 U/L   ALT 10 0 - 44 U/L   Alkaline Phosphatase 106 38 - 126 U/L   Total Bilirubin 0.3 0.0 - 1.2 mg/dL   GFR, Estimated >13 >24 mL/min   Anion gap 5 5 - 15    Treatments: steroids: betamethasone x 2   Hospital Course:  Stefanie Waters is a 23 year old G2P1001 at 30 weeks and 5 days, with a due date of Jan 09, 2024, based on her Last Menstrual Period. She has been admitted due to severe preeclampsia. 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   Stefanie Waters first visited the [facility] triage on November 03, 2023, reporting facial and ankle swelling, a persistent headache unresponsive to Tylenol, and visual disturbances characterized by spots. Initially, her blood pressure readings were in the severe range,  later stabilizing to mild levels. Laboratory tests, including CBC and CMP, were within normal limits, but her urine protein-to-creatinine ratio was elevated at 2.0. To prevent seizures, a magnesium sulfate infusion was initiated, and she received two doses of betamethasone, with the last  administered on November 04, 2023, at 11:36 PM. Her headache eventually subsided with treatment, although she continued to experience intermittent scotomas that worsened with movement but improved with rest.  A maternal-fetal medicine (MFM) consultation was completed on November 04, 2023, recommending weekly to bi-weekly umbilical artery Doppler studies due to the severe fetal growth restriction. The target for delivery is set at 34 weeks. After a thorough discussion regarding her need for high-risk antepartum management, a transfer to a tertiary care center was advised. The risks and benefits were explained to [place] and her aunt, who agreed to the recommendations. [facility] accepted her transfer, and she was transferred via [facility] in stable condition.   Discharge Physical Exam:  BP 133/89 (BP Location: Left Arm)   Pulse (!) 104   Temp 98 F (36.7 C) (Oral)   Resp 16   Ht 5\' 6"  (1.676 m)   Wt 70.8 kg   LMP 04/04/2023   SpO2 100%   BMI 25.18 kg/m   General: NAD CV: RRR Pulm: CTABL, nl effort ABD: s/nd/nt, gravid DVT Evaluation: LE non-ttp, no evidence of DVT on exam.  ZOX:[id number]  Fetal monitoring: Baseline: 125 bpm, Variability: minimal, Accelerations: 10 x 10 present, and Decelerations: Absent TOCO: none   Discharge Condition: Stable  Disposition: Discharge disposition: [facility] Not Defined        Allergies as of 11/05/2023   No Known Allergies      Medication List     STOP taking these medications    escitalopram 5 MG tablet Commonly known as: [id number]         Signed:  Gustavo Lah 11/05/2023 7:02 PM ----- Margaretmary Eddy, CNM Certified Nurse Midwife [facility]

## 2023-11-05 NOTE — Progress Notes (Signed)
 Antepartum Note    Subjective:  Continues to feel fatigued, notices more spots when she tries to move around more but they go away when she rests, denies HA or RUQ pain. Denies cramping, contractions, LOF, or vaginal bleeding. Endorses good fetal movement.   Objective:   Vitals:   11/05/23 1820 11/05/23 1825 11/05/23 1830 11/05/23 1836  BP:    118/88  Pulse:    (!) 112  Resp:   18   Temp:      TempSrc:      SpO2: 98% 97% 97% 99%  Weight:      Height:        Current Vital Signs 24h Vital Sign Ranges  T 98.4 F (36.9 C) Temp  Avg: 98.2 F (36.8 C)  Min: 98 F (36.7 C)  Max: 98.4 F (36.9 C)  BP 118/88 BP  Min: 108/61  Max: 145/82  HR (!) 112 Pulse  Avg: 97.4  Min: 81  Max: 112  RR 18 Resp  Avg: 17.2  Min: 16  Max: 18  SaO2 99 %   SpO2  Avg: 98 %  Min: 95 %  Max: 100 %       Vitals:   11/05/23 0835 11/05/23 0935 11/05/23 1035 11/05/23 1135  BP: 121/82 127/74 138/82 139/83   11/05/23 1235 11/05/23 1335 11/05/23 1435 11/05/23 1535  BP: (!) 121/59 134/77 137/86 130/79   11/05/23 1635 11/05/23 1735 11/05/23 1836 11/05/23 1847  BP: 120/70 122/65 118/88 133/89     Gen: fatigued, cooperative, no distress FHR: Baseline: 125 bpm, Variability: minimal, Accels: Present 10x10, Decels: none Toco: none SVE: deferred   Medications SCHEDULED MEDICATIONS   docusate sodium  100 mg Oral Daily   ferrous sulfate  325 mg Oral BID WC   oxytocin 40 units in LR 1000 mL  333 mL Intravenous Once    MEDICATION INFUSIONS   lactated ringers 10 mL/hr at 11/05/23 1830   magnesium sulfate 2 g/hr (11/05/23 1830)   oxytocin      PRN MEDICATIONS  acetaminophen, calcium carbonate, labetalol **AND** labetalol **AND** labetalol **AND** hydrALAZINE **AND** Measure blood pressure, lidocaine (PF), ondansetron, sodium citrate-citric acid   Assessment & Plan:  23 y.o. G2P1001 at [redacted]w[redacted]d admitted for Preeclampsia with severe features and severe IUGR  High risk antepartum -Discussed care with Dr.  Dalbert Garnet. MFM consult completed today. Recommends inpatient admission with plan for delivery at 34 weeks. She will require weekly to twice weekly UAD d/t severe fetal growth restriction.  At this time, Dr. Dalbert Garnet and I recommend transfer to tertiary center for high risk obstetric care and continued inpatient antepartum management given her early gestation, preeclampsia, and severe fetal growth restriction.   -I discussed our concerns with Stefanie Waters and her aunt. Risks and benefits discussed along with antepartum management and delivery timing. They are amenable to transfer.   -Dr. Duffy Rhody with [facility] is able to accept Stefanie Waters.  -Nursing notified of accepting facility and transfer process initiated   Stefanie Waters, [place]  11/05/2023 6:48 PM  [facility]

## 2023-11-05 NOTE — Consult Note (Signed)
 [facility]  Prenatal Consult      11/05/2023   10:45 AM  I was asked by Dr. Feliberto Gottron to consult on this patient for possible preterm delivery. I had the pleasure of meeting with Stefanie Waters today.   She is a 23 yo G57P1001 female presenting at [redacted]w[redacted]d IUP with concerns for preeclampsia with severe features. Pregnancy has also been complicated by severe IUGR (<1%). She has received BMZ x2 (last dose 2330 3/25). Most recent estimated fetal weight of 937 grams on 3/21. She is having a baby girl.  We discussed the possible needs for an infant born at this gestation. I explained that the neonatal intensive care team would be present for the delivery and outlined the likely delivery room course for this baby including routine resuscitation and NRP-guided approaches to the treatment of respiratory distress. We discussed the potential need for CPAP or mechanical ventilation and surfactant administration for respiratory distress, IV fluids pending establishment of enteral feeds, temperature support, and monitoring.   We discussed other common problems associated with prematurity including respiratory distress syndrome/CLD, feeding issues, temperature regulation, and infection risk. I also discussed the potential risk of complications such as intracranial hemorrhage, chronic lung disease, and a range of long term developmental delays, and how these risks decrease with each additional week of gestation.  We discussed the importance of good nutrition and various methods of providing nutrition (parenteral hyperalimentation, gavage feedings and/or oral feeding). We discussed the benefits of human milk. I encouraged breast feeding and pumping soon after birth and outlined resources that are available to support breast feeding. We discussed the possibility of using donor breast milk as a bridge, and she expressed the desire to use DBM if needed to supplement MBM.  We discussed the average  length of stay but I noted that the actual LOS would depend on the severity of problems encountered and response to treatments. We discussed visitation policies and the resources available while her child is in the hospital.  She expressed understanding and agreement with the plan for resuscitation and intensive care. All questions were answered.  Thank you for involving [place] in the care of this patient. A member of our team will be available should the family have additional questions.   Simone Curia, MD Neonatal Medicine  I spent ~40 minutes in consultation time, of which 25 minutes was spent in direct face to face counseling.

## 2023-11-06 LAB — MISC LABCORP TEST (SEND OUT): Labcorp test code: 486226

## 2023-11-07 ENCOUNTER — Ambulatory Visit

## 2023-11-07 ENCOUNTER — Other Ambulatory Visit

## 2023-11-27 ENCOUNTER — Other Ambulatory Visit

## 2023-11-27 ENCOUNTER — Ambulatory Visit
